# Patient Record
Sex: Female | Born: 1940
Health system: Southern US, Community
[De-identification: ages and names within clinical notes are randomized; demographics above are authoritative.]

---

## 1999-12-31 ENCOUNTER — Other Ambulatory Visit: Admission: RE | Admit: 1999-12-31 | Discharge: 1999-12-31 | Payer: Self-pay | Admitting: Family Medicine

## 2002-08-08 ENCOUNTER — Encounter: Payer: Self-pay | Admitting: General Surgery

## 2002-08-08 ENCOUNTER — Encounter: Admission: RE | Admit: 2002-08-08 | Discharge: 2002-08-08 | Payer: Self-pay | Admitting: General Surgery

## 2003-08-09 ENCOUNTER — Encounter: Admission: RE | Admit: 2003-08-09 | Discharge: 2003-08-09 | Payer: Self-pay | Admitting: General Surgery

## 2004-07-22 ENCOUNTER — Ambulatory Visit: Payer: Self-pay | Admitting: Family Medicine

## 2004-08-07 ENCOUNTER — Ambulatory Visit: Payer: Self-pay | Admitting: Family Medicine

## 2004-09-30 ENCOUNTER — Ambulatory Visit: Payer: Self-pay | Admitting: Family Medicine

## 2005-08-04 ENCOUNTER — Encounter: Admission: RE | Admit: 2005-08-04 | Discharge: 2005-08-04 | Payer: Self-pay | Admitting: Family Medicine

## 2006-09-01 ENCOUNTER — Encounter: Admission: RE | Admit: 2006-09-01 | Discharge: 2006-09-01 | Payer: Self-pay | Admitting: Family Medicine

## 2007-09-06 ENCOUNTER — Encounter: Admission: RE | Admit: 2007-09-06 | Discharge: 2007-09-06 | Payer: Self-pay | Admitting: Family Medicine

## 2008-09-10 ENCOUNTER — Encounter: Admission: RE | Admit: 2008-09-10 | Discharge: 2008-09-10 | Payer: Self-pay | Admitting: Family Medicine

## 2009-09-17 ENCOUNTER — Encounter: Admission: RE | Admit: 2009-09-17 | Discharge: 2009-09-17 | Payer: Self-pay | Admitting: Family Medicine

## 2010-08-27 ENCOUNTER — Other Ambulatory Visit: Payer: Self-pay | Admitting: Family Medicine

## 2010-08-27 DIAGNOSIS — Z1231 Encounter for screening mammogram for malignant neoplasm of breast: Secondary | ICD-10-CM

## 2010-10-02 ENCOUNTER — Ambulatory Visit
Admission: RE | Admit: 2010-10-02 | Discharge: 2010-10-02 | Disposition: A | Discharge status: Home / Self Care | Payer: No Typology Code available for payment source | Source: Ambulatory Visit | Attending: Family Medicine | Admitting: Family Medicine

## 2010-10-02 DIAGNOSIS — Z1231 Encounter for screening mammogram for malignant neoplasm of breast: Secondary | ICD-10-CM

## 2011-03-19 DIAGNOSIS — E119 Type 2 diabetes mellitus without complications: Secondary | ICD-10-CM | POA: Diagnosis not present

## 2011-03-19 DIAGNOSIS — I10 Essential (primary) hypertension: Secondary | ICD-10-CM | POA: Diagnosis not present

## 2011-03-19 DIAGNOSIS — E782 Mixed hyperlipidemia: Secondary | ICD-10-CM | POA: Diagnosis not present

## 2011-07-21 DIAGNOSIS — E782 Mixed hyperlipidemia: Secondary | ICD-10-CM | POA: Diagnosis not present

## 2011-07-21 DIAGNOSIS — E119 Type 2 diabetes mellitus without complications: Secondary | ICD-10-CM | POA: Diagnosis not present

## 2011-07-21 DIAGNOSIS — I10 Essential (primary) hypertension: Secondary | ICD-10-CM | POA: Diagnosis not present

## 2011-10-26 ENCOUNTER — Other Ambulatory Visit: Payer: Self-pay | Admitting: Family Medicine

## 2011-10-26 DIAGNOSIS — Z1231 Encounter for screening mammogram for malignant neoplasm of breast: Secondary | ICD-10-CM

## 2011-11-16 ENCOUNTER — Ambulatory Visit
Admission: RE | Admit: 2011-11-16 | Discharge: 2011-11-16 | Disposition: A | Discharge status: Home / Self Care | Payer: No Typology Code available for payment source | Source: Ambulatory Visit | Attending: Family Medicine | Admitting: Family Medicine

## 2011-11-16 DIAGNOSIS — Z1231 Encounter for screening mammogram for malignant neoplasm of breast: Secondary | ICD-10-CM

## 2011-11-24 DIAGNOSIS — I10 Essential (primary) hypertension: Secondary | ICD-10-CM | POA: Diagnosis not present

## 2011-11-24 DIAGNOSIS — E782 Mixed hyperlipidemia: Secondary | ICD-10-CM | POA: Diagnosis not present

## 2011-11-24 DIAGNOSIS — E119 Type 2 diabetes mellitus without complications: Secondary | ICD-10-CM | POA: Diagnosis not present

## 2012-03-31 DIAGNOSIS — E119 Type 2 diabetes mellitus without complications: Secondary | ICD-10-CM | POA: Diagnosis not present

## 2012-03-31 DIAGNOSIS — I10 Essential (primary) hypertension: Secondary | ICD-10-CM | POA: Diagnosis not present

## 2012-03-31 DIAGNOSIS — E782 Mixed hyperlipidemia: Secondary | ICD-10-CM | POA: Diagnosis not present

## 2012-08-16 DIAGNOSIS — E119 Type 2 diabetes mellitus without complications: Secondary | ICD-10-CM | POA: Diagnosis not present

## 2012-08-16 DIAGNOSIS — Z1331 Encounter for screening for depression: Secondary | ICD-10-CM | POA: Diagnosis not present

## 2012-08-16 DIAGNOSIS — E782 Mixed hyperlipidemia: Secondary | ICD-10-CM | POA: Diagnosis not present

## 2012-08-16 DIAGNOSIS — I10 Essential (primary) hypertension: Secondary | ICD-10-CM | POA: Diagnosis not present

## 2012-10-19 ENCOUNTER — Other Ambulatory Visit: Payer: Self-pay

## 2012-10-19 DIAGNOSIS — Z1231 Encounter for screening mammogram for malignant neoplasm of breast: Secondary | ICD-10-CM

## 2012-11-16 ENCOUNTER — Ambulatory Visit
Admission: RE | Admit: 2012-11-16 | Discharge: 2012-11-16 | Disposition: A | Discharge status: Home / Self Care | Payer: No Typology Code available for payment source | Source: Ambulatory Visit

## 2012-11-16 DIAGNOSIS — Z1231 Encounter for screening mammogram for malignant neoplasm of breast: Secondary | ICD-10-CM

## 2012-12-22 DIAGNOSIS — E119 Type 2 diabetes mellitus without complications: Secondary | ICD-10-CM | POA: Diagnosis not present

## 2012-12-22 DIAGNOSIS — I10 Essential (primary) hypertension: Secondary | ICD-10-CM | POA: Diagnosis not present

## 2012-12-22 DIAGNOSIS — Z1389 Encounter for screening for other disorder: Secondary | ICD-10-CM | POA: Diagnosis not present

## 2012-12-22 DIAGNOSIS — E782 Mixed hyperlipidemia: Secondary | ICD-10-CM | POA: Diagnosis not present

## 2013-04-20 DIAGNOSIS — E119 Type 2 diabetes mellitus without complications: Secondary | ICD-10-CM | POA: Diagnosis not present

## 2013-04-20 DIAGNOSIS — Z6828 Body mass index (BMI) 28.0-28.9, adult: Secondary | ICD-10-CM | POA: Diagnosis not present

## 2013-04-20 DIAGNOSIS — I10 Essential (primary) hypertension: Secondary | ICD-10-CM | POA: Diagnosis not present

## 2013-04-20 DIAGNOSIS — E782 Mixed hyperlipidemia: Secondary | ICD-10-CM | POA: Diagnosis not present

## 2013-08-22 DIAGNOSIS — E119 Type 2 diabetes mellitus without complications: Secondary | ICD-10-CM | POA: Diagnosis not present

## 2013-08-22 DIAGNOSIS — E782 Mixed hyperlipidemia: Secondary | ICD-10-CM | POA: Diagnosis not present

## 2013-08-22 DIAGNOSIS — I10 Essential (primary) hypertension: Secondary | ICD-10-CM | POA: Diagnosis not present

## 2013-08-22 DIAGNOSIS — Z6827 Body mass index (BMI) 27.0-27.9, adult: Secondary | ICD-10-CM | POA: Diagnosis not present

## 2013-08-22 DIAGNOSIS — IMO0001 Reserved for inherently not codable concepts without codable children: Secondary | ICD-10-CM | POA: Diagnosis not present

## 2013-10-12 ENCOUNTER — Other Ambulatory Visit: Payer: Self-pay

## 2013-10-12 DIAGNOSIS — Z1231 Encounter for screening mammogram for malignant neoplasm of breast: Secondary | ICD-10-CM

## 2013-11-20 ENCOUNTER — Ambulatory Visit
Admission: RE | Admit: 2013-11-20 | Discharge: 2013-11-20 | Disposition: A | Discharge status: Home / Self Care | Payer: Medicare Other | Source: Ambulatory Visit

## 2013-11-20 DIAGNOSIS — Z1231 Encounter for screening mammogram for malignant neoplasm of breast: Secondary | ICD-10-CM | POA: Diagnosis not present

## 2013-12-20 DIAGNOSIS — Z1389 Encounter for screening for other disorder: Secondary | ICD-10-CM | POA: Diagnosis not present

## 2013-12-20 DIAGNOSIS — Z0000 Encounter for general adult medical examination without abnormal findings: Secondary | ICD-10-CM | POA: Diagnosis not present

## 2013-12-20 DIAGNOSIS — E782 Mixed hyperlipidemia: Secondary | ICD-10-CM | POA: Diagnosis not present

## 2013-12-20 DIAGNOSIS — E119 Type 2 diabetes mellitus without complications: Secondary | ICD-10-CM | POA: Diagnosis not present

## 2013-12-20 DIAGNOSIS — E1165 Type 2 diabetes mellitus with hyperglycemia: Secondary | ICD-10-CM | POA: Diagnosis not present

## 2013-12-20 DIAGNOSIS — I10 Essential (primary) hypertension: Secondary | ICD-10-CM | POA: Diagnosis not present

## 2013-12-20 DIAGNOSIS — Z139 Encounter for screening, unspecified: Secondary | ICD-10-CM | POA: Diagnosis not present

## 2014-04-16 DIAGNOSIS — E119 Type 2 diabetes mellitus without complications: Secondary | ICD-10-CM | POA: Diagnosis not present

## 2014-05-07 DIAGNOSIS — E785 Hyperlipidemia, unspecified: Secondary | ICD-10-CM | POA: Diagnosis not present

## 2014-05-07 DIAGNOSIS — E119 Type 2 diabetes mellitus without complications: Secondary | ICD-10-CM | POA: Diagnosis not present

## 2014-05-07 DIAGNOSIS — I10 Essential (primary) hypertension: Secondary | ICD-10-CM | POA: Diagnosis not present

## 2014-05-07 DIAGNOSIS — E782 Mixed hyperlipidemia: Secondary | ICD-10-CM | POA: Diagnosis not present

## 2014-05-07 DIAGNOSIS — E1169 Type 2 diabetes mellitus with other specified complication: Secondary | ICD-10-CM | POA: Diagnosis not present

## 2014-09-17 DIAGNOSIS — I10 Essential (primary) hypertension: Secondary | ICD-10-CM | POA: Diagnosis not present

## 2014-09-17 DIAGNOSIS — E1169 Type 2 diabetes mellitus with other specified complication: Secondary | ICD-10-CM | POA: Diagnosis not present

## 2014-09-17 DIAGNOSIS — E119 Type 2 diabetes mellitus without complications: Secondary | ICD-10-CM | POA: Diagnosis not present

## 2014-09-17 DIAGNOSIS — E1159 Type 2 diabetes mellitus with other circulatory complications: Secondary | ICD-10-CM | POA: Diagnosis not present

## 2014-09-17 DIAGNOSIS — E785 Hyperlipidemia, unspecified: Secondary | ICD-10-CM | POA: Diagnosis not present

## 2014-10-30 ENCOUNTER — Other Ambulatory Visit: Payer: Self-pay

## 2014-10-30 DIAGNOSIS — Z1231 Encounter for screening mammogram for malignant neoplasm of breast: Secondary | ICD-10-CM

## 2014-11-05 DIAGNOSIS — E782 Mixed hyperlipidemia: Secondary | ICD-10-CM | POA: Diagnosis not present

## 2014-11-05 DIAGNOSIS — I10 Essential (primary) hypertension: Secondary | ICD-10-CM | POA: Diagnosis not present

## 2014-11-05 DIAGNOSIS — E1159 Type 2 diabetes mellitus with other circulatory complications: Secondary | ICD-10-CM | POA: Diagnosis not present

## 2014-11-22 ENCOUNTER — Ambulatory Visit
Admission: RE | Admit: 2014-11-22 | Discharge: 2014-11-22 | Disposition: A | Discharge status: Home / Self Care | Payer: Medicare Other | Source: Ambulatory Visit

## 2014-11-22 DIAGNOSIS — Z1231 Encounter for screening mammogram for malignant neoplasm of breast: Secondary | ICD-10-CM | POA: Diagnosis not present

## 2015-01-01 DIAGNOSIS — Z139 Encounter for screening, unspecified: Secondary | ICD-10-CM | POA: Diagnosis not present

## 2015-01-01 DIAGNOSIS — Z1389 Encounter for screening for other disorder: Secondary | ICD-10-CM | POA: Diagnosis not present

## 2015-01-01 DIAGNOSIS — Z9181 History of falling: Secondary | ICD-10-CM | POA: Diagnosis not present

## 2015-01-01 DIAGNOSIS — Z0000 Encounter for general adult medical examination without abnormal findings: Secondary | ICD-10-CM | POA: Diagnosis not present

## 2015-01-21 DIAGNOSIS — E1159 Type 2 diabetes mellitus with other circulatory complications: Secondary | ICD-10-CM | POA: Diagnosis not present

## 2015-01-21 DIAGNOSIS — E785 Hyperlipidemia, unspecified: Secondary | ICD-10-CM | POA: Diagnosis not present

## 2015-01-21 DIAGNOSIS — E119 Type 2 diabetes mellitus without complications: Secondary | ICD-10-CM | POA: Diagnosis not present

## 2015-01-21 DIAGNOSIS — E1169 Type 2 diabetes mellitus with other specified complication: Secondary | ICD-10-CM | POA: Diagnosis not present

## 2015-03-20 DIAGNOSIS — E119 Type 2 diabetes mellitus without complications: Secondary | ICD-10-CM | POA: Diagnosis not present

## 2015-03-20 DIAGNOSIS — E782 Mixed hyperlipidemia: Secondary | ICD-10-CM | POA: Diagnosis not present

## 2015-03-20 DIAGNOSIS — E1169 Type 2 diabetes mellitus with other specified complication: Secondary | ICD-10-CM | POA: Diagnosis not present

## 2015-03-29 DIAGNOSIS — I10 Essential (primary) hypertension: Secondary | ICD-10-CM | POA: Diagnosis not present

## 2015-03-29 DIAGNOSIS — E785 Hyperlipidemia, unspecified: Secondary | ICD-10-CM | POA: Diagnosis not present

## 2015-03-29 DIAGNOSIS — E119 Type 2 diabetes mellitus without complications: Secondary | ICD-10-CM | POA: Diagnosis not present

## 2015-03-29 DIAGNOSIS — E1169 Type 2 diabetes mellitus with other specified complication: Secondary | ICD-10-CM | POA: Diagnosis not present

## 2015-07-17 DIAGNOSIS — E1169 Type 2 diabetes mellitus with other specified complication: Secondary | ICD-10-CM | POA: Diagnosis not present

## 2015-07-17 DIAGNOSIS — E1159 Type 2 diabetes mellitus with other circulatory complications: Secondary | ICD-10-CM | POA: Diagnosis not present

## 2015-07-17 DIAGNOSIS — E119 Type 2 diabetes mellitus without complications: Secondary | ICD-10-CM | POA: Diagnosis not present

## 2015-07-22 DIAGNOSIS — E1129 Type 2 diabetes mellitus with other diabetic kidney complication: Secondary | ICD-10-CM | POA: Diagnosis not present

## 2015-07-22 DIAGNOSIS — E1159 Type 2 diabetes mellitus with other circulatory complications: Secondary | ICD-10-CM | POA: Diagnosis not present

## 2015-07-22 DIAGNOSIS — I10 Essential (primary) hypertension: Secondary | ICD-10-CM | POA: Diagnosis not present

## 2015-07-22 DIAGNOSIS — E782 Mixed hyperlipidemia: Secondary | ICD-10-CM | POA: Diagnosis not present

## 2015-10-21 ENCOUNTER — Other Ambulatory Visit: Payer: Self-pay | Admitting: Family Medicine

## 2015-10-21 DIAGNOSIS — Z1231 Encounter for screening mammogram for malignant neoplasm of breast: Secondary | ICD-10-CM

## 2015-11-05 DIAGNOSIS — E1169 Type 2 diabetes mellitus with other specified complication: Secondary | ICD-10-CM | POA: Diagnosis not present

## 2015-11-05 DIAGNOSIS — E119 Type 2 diabetes mellitus without complications: Secondary | ICD-10-CM | POA: Diagnosis not present

## 2015-11-05 DIAGNOSIS — E1159 Type 2 diabetes mellitus with other circulatory complications: Secondary | ICD-10-CM | POA: Diagnosis not present

## 2015-11-18 DIAGNOSIS — I10 Essential (primary) hypertension: Secondary | ICD-10-CM | POA: Diagnosis not present

## 2015-11-18 DIAGNOSIS — E1159 Type 2 diabetes mellitus with other circulatory complications: Secondary | ICD-10-CM | POA: Diagnosis not present

## 2015-11-18 DIAGNOSIS — E119 Type 2 diabetes mellitus without complications: Secondary | ICD-10-CM | POA: Diagnosis not present

## 2015-11-18 DIAGNOSIS — E782 Mixed hyperlipidemia: Secondary | ICD-10-CM | POA: Diagnosis not present

## 2015-11-26 ENCOUNTER — Ambulatory Visit
Admission: RE | Admit: 2015-11-26 | Discharge: 2015-11-26 | Disposition: A | Discharge status: Home / Self Care | Payer: Medicare Other | Source: Ambulatory Visit | Attending: Family Medicine | Admitting: Family Medicine

## 2015-11-26 DIAGNOSIS — Z1231 Encounter for screening mammogram for malignant neoplasm of breast: Secondary | ICD-10-CM | POA: Diagnosis not present

## 2016-01-15 DIAGNOSIS — Z1389 Encounter for screening for other disorder: Secondary | ICD-10-CM | POA: Diagnosis not present

## 2016-01-15 DIAGNOSIS — Z0000 Encounter for general adult medical examination without abnormal findings: Secondary | ICD-10-CM | POA: Diagnosis not present

## 2016-01-15 DIAGNOSIS — Z139 Encounter for screening, unspecified: Secondary | ICD-10-CM | POA: Diagnosis not present

## 2016-04-06 DIAGNOSIS — E1159 Type 2 diabetes mellitus with other circulatory complications: Secondary | ICD-10-CM | POA: Diagnosis not present

## 2016-04-06 DIAGNOSIS — E119 Type 2 diabetes mellitus without complications: Secondary | ICD-10-CM | POA: Diagnosis not present

## 2016-04-06 DIAGNOSIS — E1169 Type 2 diabetes mellitus with other specified complication: Secondary | ICD-10-CM | POA: Diagnosis not present

## 2016-04-10 DIAGNOSIS — E1159 Type 2 diabetes mellitus with other circulatory complications: Secondary | ICD-10-CM | POA: Diagnosis not present

## 2016-04-10 DIAGNOSIS — E1129 Type 2 diabetes mellitus with other diabetic kidney complication: Secondary | ICD-10-CM | POA: Diagnosis not present

## 2016-04-10 DIAGNOSIS — I10 Essential (primary) hypertension: Secondary | ICD-10-CM | POA: Diagnosis not present

## 2016-04-10 DIAGNOSIS — E782 Mixed hyperlipidemia: Secondary | ICD-10-CM | POA: Diagnosis not present

## 2016-08-12 DIAGNOSIS — E1159 Type 2 diabetes mellitus with other circulatory complications: Secondary | ICD-10-CM | POA: Diagnosis not present

## 2016-08-12 DIAGNOSIS — E119 Type 2 diabetes mellitus without complications: Secondary | ICD-10-CM | POA: Diagnosis not present

## 2016-08-12 DIAGNOSIS — E1169 Type 2 diabetes mellitus with other specified complication: Secondary | ICD-10-CM | POA: Diagnosis not present

## 2016-08-24 ENCOUNTER — Other Ambulatory Visit: Payer: Self-pay

## 2016-09-21 DIAGNOSIS — Z139 Encounter for screening, unspecified: Secondary | ICD-10-CM | POA: Diagnosis not present

## 2016-09-21 DIAGNOSIS — I10 Essential (primary) hypertension: Secondary | ICD-10-CM | POA: Diagnosis not present

## 2016-09-21 DIAGNOSIS — F3160 Bipolar disorder, current episode mixed, unspecified: Secondary | ICD-10-CM | POA: Diagnosis not present

## 2016-09-21 DIAGNOSIS — E119 Type 2 diabetes mellitus without complications: Secondary | ICD-10-CM | POA: Diagnosis not present

## 2016-09-21 DIAGNOSIS — E1169 Type 2 diabetes mellitus with other specified complication: Secondary | ICD-10-CM | POA: Diagnosis not present

## 2016-09-21 DIAGNOSIS — M79609 Pain in unspecified limb: Secondary | ICD-10-CM | POA: Diagnosis not present

## 2016-09-21 DIAGNOSIS — E1159 Type 2 diabetes mellitus with other circulatory complications: Secondary | ICD-10-CM | POA: Diagnosis not present

## 2016-09-21 DIAGNOSIS — Z1389 Encounter for screening for other disorder: Secondary | ICD-10-CM | POA: Diagnosis not present

## 2016-09-21 DIAGNOSIS — E109 Type 1 diabetes mellitus without complications: Secondary | ICD-10-CM | POA: Diagnosis not present

## 2016-10-23 ENCOUNTER — Other Ambulatory Visit: Payer: Self-pay | Admitting: Family Medicine

## 2016-10-23 DIAGNOSIS — Z1231 Encounter for screening mammogram for malignant neoplasm of breast: Secondary | ICD-10-CM

## 2016-11-26 ENCOUNTER — Ambulatory Visit
Admission: RE | Admit: 2016-11-26 | Discharge: 2016-11-26 | Disposition: A | Discharge status: Home / Self Care | Payer: Medicare Other | Source: Ambulatory Visit | Attending: Family Medicine | Admitting: Family Medicine

## 2016-11-26 DIAGNOSIS — Z1231 Encounter for screening mammogram for malignant neoplasm of breast: Secondary | ICD-10-CM

## 2017-01-13 DIAGNOSIS — E1159 Type 2 diabetes mellitus with other circulatory complications: Secondary | ICD-10-CM | POA: Diagnosis not present

## 2017-01-13 DIAGNOSIS — E119 Type 2 diabetes mellitus without complications: Secondary | ICD-10-CM | POA: Diagnosis not present

## 2017-01-13 DIAGNOSIS — E1169 Type 2 diabetes mellitus with other specified complication: Secondary | ICD-10-CM | POA: Diagnosis not present

## 2017-01-18 DIAGNOSIS — Z0000 Encounter for general adult medical examination without abnormal findings: Secondary | ICD-10-CM | POA: Diagnosis not present

## 2017-01-18 DIAGNOSIS — Z9181 History of falling: Secondary | ICD-10-CM | POA: Diagnosis not present

## 2017-01-18 DIAGNOSIS — Z1331 Encounter for screening for depression: Secondary | ICD-10-CM | POA: Diagnosis not present

## 2017-01-18 DIAGNOSIS — E1159 Type 2 diabetes mellitus with other circulatory complications: Secondary | ICD-10-CM | POA: Diagnosis not present

## 2017-01-18 DIAGNOSIS — E1169 Type 2 diabetes mellitus with other specified complication: Secondary | ICD-10-CM | POA: Diagnosis not present

## 2017-01-18 DIAGNOSIS — I10 Essential (primary) hypertension: Secondary | ICD-10-CM | POA: Diagnosis not present

## 2017-01-18 DIAGNOSIS — Z139 Encounter for screening, unspecified: Secondary | ICD-10-CM | POA: Diagnosis not present

## 2017-01-18 DIAGNOSIS — E119 Type 2 diabetes mellitus without complications: Secondary | ICD-10-CM | POA: Diagnosis not present

## 2017-05-10 DIAGNOSIS — E119 Type 2 diabetes mellitus without complications: Secondary | ICD-10-CM | POA: Diagnosis not present

## 2017-05-10 DIAGNOSIS — E1159 Type 2 diabetes mellitus with other circulatory complications: Secondary | ICD-10-CM | POA: Diagnosis not present

## 2017-05-10 DIAGNOSIS — E1169 Type 2 diabetes mellitus with other specified complication: Secondary | ICD-10-CM | POA: Diagnosis not present

## 2017-05-19 DIAGNOSIS — E1159 Type 2 diabetes mellitus with other circulatory complications: Secondary | ICD-10-CM | POA: Diagnosis not present

## 2017-05-19 DIAGNOSIS — I10 Essential (primary) hypertension: Secondary | ICD-10-CM | POA: Diagnosis not present

## 2017-05-19 DIAGNOSIS — E1169 Type 2 diabetes mellitus with other specified complication: Secondary | ICD-10-CM | POA: Diagnosis not present

## 2017-05-19 DIAGNOSIS — E119 Type 2 diabetes mellitus without complications: Secondary | ICD-10-CM | POA: Diagnosis not present

## 2017-06-15 DIAGNOSIS — E113292 Type 2 diabetes mellitus with mild nonproliferative diabetic retinopathy without macular edema, left eye: Secondary | ICD-10-CM | POA: Diagnosis not present

## 2017-06-15 DIAGNOSIS — H25813 Combined forms of age-related cataract, bilateral: Secondary | ICD-10-CM | POA: Diagnosis not present

## 2017-09-14 DIAGNOSIS — E119 Type 2 diabetes mellitus without complications: Secondary | ICD-10-CM | POA: Diagnosis not present

## 2017-09-14 DIAGNOSIS — E1159 Type 2 diabetes mellitus with other circulatory complications: Secondary | ICD-10-CM | POA: Diagnosis not present

## 2017-09-14 DIAGNOSIS — E1169 Type 2 diabetes mellitus with other specified complication: Secondary | ICD-10-CM | POA: Diagnosis not present

## 2017-09-20 DIAGNOSIS — E1159 Type 2 diabetes mellitus with other circulatory complications: Secondary | ICD-10-CM | POA: Diagnosis not present

## 2017-09-20 DIAGNOSIS — Z1331 Encounter for screening for depression: Secondary | ICD-10-CM | POA: Diagnosis not present

## 2017-09-20 DIAGNOSIS — E785 Hyperlipidemia, unspecified: Secondary | ICD-10-CM | POA: Diagnosis not present

## 2017-09-20 DIAGNOSIS — E1169 Type 2 diabetes mellitus with other specified complication: Secondary | ICD-10-CM | POA: Diagnosis not present

## 2017-09-20 DIAGNOSIS — Z0000 Encounter for general adult medical examination without abnormal findings: Secondary | ICD-10-CM | POA: Diagnosis not present

## 2017-09-20 DIAGNOSIS — E119 Type 2 diabetes mellitus without complications: Secondary | ICD-10-CM | POA: Diagnosis not present

## 2017-10-18 ENCOUNTER — Other Ambulatory Visit: Payer: Self-pay | Admitting: Family Medicine

## 2017-10-18 DIAGNOSIS — Z1231 Encounter for screening mammogram for malignant neoplasm of breast: Secondary | ICD-10-CM

## 2017-11-29 ENCOUNTER — Ambulatory Visit
Admission: RE | Admit: 2017-11-29 | Discharge: 2017-11-29 | Disposition: A | Discharge status: Home / Self Care | Payer: Medicare HMO | Source: Ambulatory Visit | Attending: Family Medicine | Admitting: Family Medicine

## 2017-11-29 DIAGNOSIS — Z1231 Encounter for screening mammogram for malignant neoplasm of breast: Secondary | ICD-10-CM | POA: Diagnosis not present

## 2017-12-13 DIAGNOSIS — E1169 Type 2 diabetes mellitus with other specified complication: Secondary | ICD-10-CM | POA: Diagnosis not present

## 2017-12-13 DIAGNOSIS — E119 Type 2 diabetes mellitus without complications: Secondary | ICD-10-CM | POA: Diagnosis not present

## 2017-12-13 DIAGNOSIS — E1159 Type 2 diabetes mellitus with other circulatory complications: Secondary | ICD-10-CM | POA: Diagnosis not present

## 2017-12-20 DIAGNOSIS — E1169 Type 2 diabetes mellitus with other specified complication: Secondary | ICD-10-CM | POA: Diagnosis not present

## 2017-12-20 DIAGNOSIS — R69 Illness, unspecified: Secondary | ICD-10-CM | POA: Diagnosis not present

## 2017-12-20 DIAGNOSIS — E1165 Type 2 diabetes mellitus with hyperglycemia: Secondary | ICD-10-CM | POA: Diagnosis not present

## 2017-12-20 DIAGNOSIS — E785 Hyperlipidemia, unspecified: Secondary | ICD-10-CM | POA: Diagnosis not present

## 2017-12-20 DIAGNOSIS — E1159 Type 2 diabetes mellitus with other circulatory complications: Secondary | ICD-10-CM | POA: Diagnosis not present

## 2018-01-19 DIAGNOSIS — E1165 Type 2 diabetes mellitus with hyperglycemia: Secondary | ICD-10-CM | POA: Diagnosis not present

## 2018-01-19 DIAGNOSIS — Z6822 Body mass index (BMI) 22.0-22.9, adult: Secondary | ICD-10-CM | POA: Diagnosis not present

## 2018-04-13 DIAGNOSIS — E1159 Type 2 diabetes mellitus with other circulatory complications: Secondary | ICD-10-CM | POA: Diagnosis not present

## 2018-04-13 DIAGNOSIS — E119 Type 2 diabetes mellitus without complications: Secondary | ICD-10-CM | POA: Diagnosis not present

## 2018-04-13 DIAGNOSIS — E1169 Type 2 diabetes mellitus with other specified complication: Secondary | ICD-10-CM | POA: Diagnosis not present

## 2018-04-20 DIAGNOSIS — E1129 Type 2 diabetes mellitus with other diabetic kidney complication: Secondary | ICD-10-CM | POA: Diagnosis not present

## 2018-04-20 DIAGNOSIS — I10 Essential (primary) hypertension: Secondary | ICD-10-CM | POA: Diagnosis not present

## 2018-04-20 DIAGNOSIS — E1169 Type 2 diabetes mellitus with other specified complication: Secondary | ICD-10-CM | POA: Diagnosis not present

## 2018-04-20 DIAGNOSIS — Z789 Other specified health status: Secondary | ICD-10-CM | POA: Diagnosis not present

## 2018-04-20 DIAGNOSIS — E1159 Type 2 diabetes mellitus with other circulatory complications: Secondary | ICD-10-CM | POA: Diagnosis not present

## 2018-05-03 DIAGNOSIS — E1169 Type 2 diabetes mellitus with other specified complication: Secondary | ICD-10-CM | POA: Diagnosis not present

## 2018-05-03 DIAGNOSIS — E1129 Type 2 diabetes mellitus with other diabetic kidney complication: Secondary | ICD-10-CM | POA: Diagnosis not present

## 2018-05-03 DIAGNOSIS — I10 Essential (primary) hypertension: Secondary | ICD-10-CM | POA: Diagnosis not present

## 2018-05-03 DIAGNOSIS — E1159 Type 2 diabetes mellitus with other circulatory complications: Secondary | ICD-10-CM | POA: Diagnosis not present

## 2018-06-02 DIAGNOSIS — E1159 Type 2 diabetes mellitus with other circulatory complications: Secondary | ICD-10-CM | POA: Diagnosis not present

## 2018-06-02 DIAGNOSIS — E1169 Type 2 diabetes mellitus with other specified complication: Secondary | ICD-10-CM | POA: Diagnosis not present

## 2018-06-02 DIAGNOSIS — I10 Essential (primary) hypertension: Secondary | ICD-10-CM | POA: Diagnosis not present

## 2018-06-02 DIAGNOSIS — E1129 Type 2 diabetes mellitus with other diabetic kidney complication: Secondary | ICD-10-CM | POA: Diagnosis not present

## 2018-07-01 DIAGNOSIS — E1169 Type 2 diabetes mellitus with other specified complication: Secondary | ICD-10-CM | POA: Diagnosis not present

## 2018-07-01 DIAGNOSIS — E1159 Type 2 diabetes mellitus with other circulatory complications: Secondary | ICD-10-CM | POA: Diagnosis not present

## 2018-07-01 DIAGNOSIS — I10 Essential (primary) hypertension: Secondary | ICD-10-CM | POA: Diagnosis not present

## 2018-07-01 DIAGNOSIS — E1129 Type 2 diabetes mellitus with other diabetic kidney complication: Secondary | ICD-10-CM | POA: Diagnosis not present

## 2018-08-02 DIAGNOSIS — E1129 Type 2 diabetes mellitus with other diabetic kidney complication: Secondary | ICD-10-CM | POA: Diagnosis not present

## 2018-08-02 DIAGNOSIS — E1159 Type 2 diabetes mellitus with other circulatory complications: Secondary | ICD-10-CM | POA: Diagnosis not present

## 2018-08-02 DIAGNOSIS — I10 Essential (primary) hypertension: Secondary | ICD-10-CM | POA: Diagnosis not present

## 2018-08-02 DIAGNOSIS — E1169 Type 2 diabetes mellitus with other specified complication: Secondary | ICD-10-CM | POA: Diagnosis not present

## 2018-08-10 DIAGNOSIS — E1169 Type 2 diabetes mellitus with other specified complication: Secondary | ICD-10-CM | POA: Diagnosis not present

## 2018-08-10 DIAGNOSIS — E1159 Type 2 diabetes mellitus with other circulatory complications: Secondary | ICD-10-CM | POA: Diagnosis not present

## 2018-08-10 DIAGNOSIS — E119 Type 2 diabetes mellitus without complications: Secondary | ICD-10-CM | POA: Diagnosis not present

## 2018-08-17 DIAGNOSIS — E785 Hyperlipidemia, unspecified: Secondary | ICD-10-CM | POA: Diagnosis not present

## 2018-08-17 DIAGNOSIS — E1169 Type 2 diabetes mellitus with other specified complication: Secondary | ICD-10-CM | POA: Diagnosis not present

## 2018-08-17 DIAGNOSIS — E119 Type 2 diabetes mellitus without complications: Secondary | ICD-10-CM | POA: Diagnosis not present

## 2018-08-17 DIAGNOSIS — E1159 Type 2 diabetes mellitus with other circulatory complications: Secondary | ICD-10-CM | POA: Diagnosis not present

## 2018-09-02 DIAGNOSIS — E1169 Type 2 diabetes mellitus with other specified complication: Secondary | ICD-10-CM | POA: Diagnosis not present

## 2018-09-02 DIAGNOSIS — E785 Hyperlipidemia, unspecified: Secondary | ICD-10-CM | POA: Diagnosis not present

## 2018-09-02 DIAGNOSIS — E1159 Type 2 diabetes mellitus with other circulatory complications: Secondary | ICD-10-CM | POA: Diagnosis not present

## 2018-09-02 DIAGNOSIS — E119 Type 2 diabetes mellitus without complications: Secondary | ICD-10-CM | POA: Diagnosis not present

## 2018-09-14 DIAGNOSIS — Z0000 Encounter for general adult medical examination without abnormal findings: Secondary | ICD-10-CM | POA: Diagnosis not present

## 2018-09-14 DIAGNOSIS — Z139 Encounter for screening, unspecified: Secondary | ICD-10-CM | POA: Diagnosis not present

## 2018-10-03 DIAGNOSIS — E1169 Type 2 diabetes mellitus with other specified complication: Secondary | ICD-10-CM | POA: Diagnosis not present

## 2018-10-03 DIAGNOSIS — E119 Type 2 diabetes mellitus without complications: Secondary | ICD-10-CM | POA: Diagnosis not present

## 2018-10-03 DIAGNOSIS — E785 Hyperlipidemia, unspecified: Secondary | ICD-10-CM | POA: Diagnosis not present

## 2018-10-03 DIAGNOSIS — I10 Essential (primary) hypertension: Secondary | ICD-10-CM | POA: Diagnosis not present

## 2018-10-18 ENCOUNTER — Other Ambulatory Visit: Payer: Self-pay | Admitting: Family Medicine

## 2018-10-18 DIAGNOSIS — Z1231 Encounter for screening mammogram for malignant neoplasm of breast: Secondary | ICD-10-CM

## 2018-11-02 DIAGNOSIS — E785 Hyperlipidemia, unspecified: Secondary | ICD-10-CM | POA: Diagnosis not present

## 2018-11-02 DIAGNOSIS — E1169 Type 2 diabetes mellitus with other specified complication: Secondary | ICD-10-CM | POA: Diagnosis not present

## 2018-11-02 DIAGNOSIS — I10 Essential (primary) hypertension: Secondary | ICD-10-CM | POA: Diagnosis not present

## 2018-11-02 DIAGNOSIS — E119 Type 2 diabetes mellitus without complications: Secondary | ICD-10-CM | POA: Diagnosis not present

## 2018-12-02 ENCOUNTER — Other Ambulatory Visit: Payer: Self-pay

## 2018-12-02 ENCOUNTER — Ambulatory Visit
Admission: RE | Admit: 2018-12-02 | Discharge: 2018-12-02 | Disposition: A | Discharge status: Home / Self Care | Payer: Medicare Other | Source: Ambulatory Visit | Attending: Family Medicine | Admitting: Family Medicine

## 2018-12-02 DIAGNOSIS — E785 Hyperlipidemia, unspecified: Secondary | ICD-10-CM | POA: Diagnosis not present

## 2018-12-02 DIAGNOSIS — Z1231 Encounter for screening mammogram for malignant neoplasm of breast: Secondary | ICD-10-CM

## 2018-12-02 DIAGNOSIS — E119 Type 2 diabetes mellitus without complications: Secondary | ICD-10-CM | POA: Diagnosis not present

## 2018-12-02 DIAGNOSIS — E1169 Type 2 diabetes mellitus with other specified complication: Secondary | ICD-10-CM | POA: Diagnosis not present

## 2018-12-02 DIAGNOSIS — I10 Essential (primary) hypertension: Secondary | ICD-10-CM | POA: Diagnosis not present

## 2019-01-02 DIAGNOSIS — E119 Type 2 diabetes mellitus without complications: Secondary | ICD-10-CM | POA: Diagnosis not present

## 2019-01-02 DIAGNOSIS — E785 Hyperlipidemia, unspecified: Secondary | ICD-10-CM | POA: Diagnosis not present

## 2019-01-02 DIAGNOSIS — I10 Essential (primary) hypertension: Secondary | ICD-10-CM | POA: Diagnosis not present

## 2019-01-02 DIAGNOSIS — E1169 Type 2 diabetes mellitus with other specified complication: Secondary | ICD-10-CM | POA: Diagnosis not present

## 2019-01-04 DIAGNOSIS — E1169 Type 2 diabetes mellitus with other specified complication: Secondary | ICD-10-CM | POA: Diagnosis not present

## 2019-01-11 DIAGNOSIS — E1159 Type 2 diabetes mellitus with other circulatory complications: Secondary | ICD-10-CM | POA: Diagnosis not present

## 2019-01-11 DIAGNOSIS — I10 Essential (primary) hypertension: Secondary | ICD-10-CM | POA: Diagnosis not present

## 2019-01-11 DIAGNOSIS — E1169 Type 2 diabetes mellitus with other specified complication: Secondary | ICD-10-CM | POA: Diagnosis not present

## 2019-01-11 DIAGNOSIS — E785 Hyperlipidemia, unspecified: Secondary | ICD-10-CM | POA: Diagnosis not present

## 2019-01-12 ENCOUNTER — Other Ambulatory Visit: Payer: Self-pay

## 2019-01-12 NOTE — Patient Outreach (Signed)
[facility] [place] [facility]) Care Management  01/12/2019  Rhonda Hunt 1940-09-24 [id number]   Medication Adherence call to Rhonda Hunt Compliant Voice message left with a call back number. Rhonda Hunt is showing past due on Lisinopril 40 mg under [place].   [place] Management Direct Dial [phone]  Fax [phone] [email or web address]

## 2019-01-23 DIAGNOSIS — H25813 Combined forms of age-related cataract, bilateral: Secondary | ICD-10-CM | POA: Diagnosis not present

## 2019-02-01 DIAGNOSIS — E785 Hyperlipidemia, unspecified: Secondary | ICD-10-CM | POA: Diagnosis not present

## 2019-02-01 DIAGNOSIS — E1159 Type 2 diabetes mellitus with other circulatory complications: Secondary | ICD-10-CM | POA: Diagnosis not present

## 2019-02-01 DIAGNOSIS — I10 Essential (primary) hypertension: Secondary | ICD-10-CM | POA: Diagnosis not present

## 2019-02-01 DIAGNOSIS — E1169 Type 2 diabetes mellitus with other specified complication: Secondary | ICD-10-CM | POA: Diagnosis not present

## 2019-03-03 DIAGNOSIS — E1169 Type 2 diabetes mellitus with other specified complication: Secondary | ICD-10-CM | POA: Diagnosis not present

## 2019-03-03 DIAGNOSIS — E1159 Type 2 diabetes mellitus with other circulatory complications: Secondary | ICD-10-CM | POA: Diagnosis not present

## 2019-03-03 DIAGNOSIS — E785 Hyperlipidemia, unspecified: Secondary | ICD-10-CM | POA: Diagnosis not present

## 2019-03-03 DIAGNOSIS — I10 Essential (primary) hypertension: Secondary | ICD-10-CM | POA: Diagnosis not present

## 2019-04-02 DIAGNOSIS — I10 Essential (primary) hypertension: Secondary | ICD-10-CM | POA: Diagnosis not present

## 2019-04-02 DIAGNOSIS — E1169 Type 2 diabetes mellitus with other specified complication: Secondary | ICD-10-CM | POA: Diagnosis not present

## 2019-04-02 DIAGNOSIS — E785 Hyperlipidemia, unspecified: Secondary | ICD-10-CM | POA: Diagnosis not present

## 2019-04-02 DIAGNOSIS — E1159 Type 2 diabetes mellitus with other circulatory complications: Secondary | ICD-10-CM | POA: Diagnosis not present

## 2019-05-03 DIAGNOSIS — E1169 Type 2 diabetes mellitus with other specified complication: Secondary | ICD-10-CM | POA: Diagnosis not present

## 2019-05-03 DIAGNOSIS — I10 Essential (primary) hypertension: Secondary | ICD-10-CM | POA: Diagnosis not present

## 2019-05-03 DIAGNOSIS — E1159 Type 2 diabetes mellitus with other circulatory complications: Secondary | ICD-10-CM | POA: Diagnosis not present

## 2019-05-03 DIAGNOSIS — E785 Hyperlipidemia, unspecified: Secondary | ICD-10-CM | POA: Diagnosis not present

## 2019-06-02 DIAGNOSIS — I10 Essential (primary) hypertension: Secondary | ICD-10-CM | POA: Diagnosis not present

## 2019-06-02 DIAGNOSIS — E1169 Type 2 diabetes mellitus with other specified complication: Secondary | ICD-10-CM | POA: Diagnosis not present

## 2019-06-02 DIAGNOSIS — E785 Hyperlipidemia, unspecified: Secondary | ICD-10-CM | POA: Diagnosis not present

## 2019-06-02 DIAGNOSIS — E1159 Type 2 diabetes mellitus with other circulatory complications: Secondary | ICD-10-CM | POA: Diagnosis not present

## 2019-07-03 DIAGNOSIS — I10 Essential (primary) hypertension: Secondary | ICD-10-CM | POA: Diagnosis not present

## 2019-07-03 DIAGNOSIS — E1169 Type 2 diabetes mellitus with other specified complication: Secondary | ICD-10-CM | POA: Diagnosis not present

## 2019-07-03 DIAGNOSIS — E785 Hyperlipidemia, unspecified: Secondary | ICD-10-CM | POA: Diagnosis not present

## 2019-07-03 DIAGNOSIS — E1159 Type 2 diabetes mellitus with other circulatory complications: Secondary | ICD-10-CM | POA: Diagnosis not present

## 2019-07-24 DIAGNOSIS — E1169 Type 2 diabetes mellitus with other specified complication: Secondary | ICD-10-CM | POA: Diagnosis not present

## 2019-08-02 DIAGNOSIS — I10 Essential (primary) hypertension: Secondary | ICD-10-CM | POA: Diagnosis not present

## 2019-08-02 DIAGNOSIS — E1169 Type 2 diabetes mellitus with other specified complication: Secondary | ICD-10-CM | POA: Diagnosis not present

## 2019-08-02 DIAGNOSIS — E785 Hyperlipidemia, unspecified: Secondary | ICD-10-CM | POA: Diagnosis not present

## 2019-08-02 DIAGNOSIS — E1159 Type 2 diabetes mellitus with other circulatory complications: Secondary | ICD-10-CM | POA: Diagnosis not present

## 2019-08-16 DIAGNOSIS — Z139 Encounter for screening, unspecified: Secondary | ICD-10-CM | POA: Diagnosis not present

## 2019-08-16 DIAGNOSIS — I152 Hypertension secondary to endocrine disorders: Secondary | ICD-10-CM | POA: Diagnosis not present

## 2019-08-16 DIAGNOSIS — Z0000 Encounter for general adult medical examination without abnormal findings: Secondary | ICD-10-CM | POA: Diagnosis not present

## 2019-08-16 DIAGNOSIS — E1159 Type 2 diabetes mellitus with other circulatory complications: Secondary | ICD-10-CM | POA: Diagnosis not present

## 2019-08-16 DIAGNOSIS — E1169 Type 2 diabetes mellitus with other specified complication: Secondary | ICD-10-CM | POA: Diagnosis not present

## 2019-08-16 DIAGNOSIS — E785 Hyperlipidemia, unspecified: Secondary | ICD-10-CM | POA: Diagnosis not present

## 2019-08-16 DIAGNOSIS — Z136 Encounter for screening for cardiovascular disorders: Secondary | ICD-10-CM | POA: Diagnosis not present

## 2019-09-03 DIAGNOSIS — E1169 Type 2 diabetes mellitus with other specified complication: Secondary | ICD-10-CM | POA: Diagnosis not present

## 2019-09-03 DIAGNOSIS — E785 Hyperlipidemia, unspecified: Secondary | ICD-10-CM | POA: Diagnosis not present

## 2019-09-03 DIAGNOSIS — E1159 Type 2 diabetes mellitus with other circulatory complications: Secondary | ICD-10-CM | POA: Diagnosis not present

## 2019-09-03 DIAGNOSIS — I152 Hypertension secondary to endocrine disorders: Secondary | ICD-10-CM | POA: Diagnosis not present

## 2019-10-03 DIAGNOSIS — E1169 Type 2 diabetes mellitus with other specified complication: Secondary | ICD-10-CM | POA: Diagnosis not present

## 2019-10-03 DIAGNOSIS — E1159 Type 2 diabetes mellitus with other circulatory complications: Secondary | ICD-10-CM | POA: Diagnosis not present

## 2019-10-03 DIAGNOSIS — I152 Hypertension secondary to endocrine disorders: Secondary | ICD-10-CM | POA: Diagnosis not present

## 2019-10-03 DIAGNOSIS — E785 Hyperlipidemia, unspecified: Secondary | ICD-10-CM | POA: Diagnosis not present

## 2019-10-04 DIAGNOSIS — I152 Hypertension secondary to endocrine disorders: Secondary | ICD-10-CM | POA: Diagnosis not present

## 2019-10-04 DIAGNOSIS — E1169 Type 2 diabetes mellitus with other specified complication: Secondary | ICD-10-CM | POA: Diagnosis not present

## 2019-10-04 DIAGNOSIS — E785 Hyperlipidemia, unspecified: Secondary | ICD-10-CM | POA: Diagnosis not present

## 2019-10-04 DIAGNOSIS — E1159 Type 2 diabetes mellitus with other circulatory complications: Secondary | ICD-10-CM | POA: Diagnosis not present

## 2019-10-16 ENCOUNTER — Other Ambulatory Visit: Payer: Self-pay | Admitting: Family Medicine

## 2019-10-16 DIAGNOSIS — Z1231 Encounter for screening mammogram for malignant neoplasm of breast: Secondary | ICD-10-CM

## 2019-11-03 DIAGNOSIS — E1169 Type 2 diabetes mellitus with other specified complication: Secondary | ICD-10-CM | POA: Diagnosis not present

## 2019-11-03 DIAGNOSIS — I152 Hypertension secondary to endocrine disorders: Secondary | ICD-10-CM | POA: Diagnosis not present

## 2019-11-03 DIAGNOSIS — E785 Hyperlipidemia, unspecified: Secondary | ICD-10-CM | POA: Diagnosis not present

## 2019-11-03 DIAGNOSIS — E1159 Type 2 diabetes mellitus with other circulatory complications: Secondary | ICD-10-CM | POA: Diagnosis not present

## 2019-12-04 DIAGNOSIS — E1169 Type 2 diabetes mellitus with other specified complication: Secondary | ICD-10-CM | POA: Diagnosis not present

## 2019-12-04 DIAGNOSIS — I152 Hypertension secondary to endocrine disorders: Secondary | ICD-10-CM | POA: Diagnosis not present

## 2019-12-04 DIAGNOSIS — E785 Hyperlipidemia, unspecified: Secondary | ICD-10-CM | POA: Diagnosis not present

## 2019-12-04 DIAGNOSIS — E1159 Type 2 diabetes mellitus with other circulatory complications: Secondary | ICD-10-CM | POA: Diagnosis not present

## 2019-12-05 ENCOUNTER — Ambulatory Visit
Admission: RE | Admit: 2019-12-05 | Discharge: 2019-12-05 | Disposition: A | Discharge status: Home / Self Care | Payer: Medicare Other | Source: Ambulatory Visit | Attending: Family Medicine | Admitting: Family Medicine

## 2019-12-05 ENCOUNTER — Other Ambulatory Visit: Payer: Self-pay

## 2019-12-05 DIAGNOSIS — Z1231 Encounter for screening mammogram for malignant neoplasm of breast: Secondary | ICD-10-CM

## 2019-12-18 DIAGNOSIS — E1169 Type 2 diabetes mellitus with other specified complication: Secondary | ICD-10-CM | POA: Diagnosis not present

## 2019-12-25 DIAGNOSIS — E119 Type 2 diabetes mellitus without complications: Secondary | ICD-10-CM | POA: Diagnosis not present

## 2019-12-25 DIAGNOSIS — E785 Hyperlipidemia, unspecified: Secondary | ICD-10-CM | POA: Diagnosis not present

## 2019-12-25 DIAGNOSIS — E1169 Type 2 diabetes mellitus with other specified complication: Secondary | ICD-10-CM | POA: Diagnosis not present

## 2019-12-25 DIAGNOSIS — E1159 Type 2 diabetes mellitus with other circulatory complications: Secondary | ICD-10-CM | POA: Diagnosis not present

## 2020-01-03 DIAGNOSIS — E1169 Type 2 diabetes mellitus with other specified complication: Secondary | ICD-10-CM | POA: Diagnosis not present

## 2020-01-03 DIAGNOSIS — E119 Type 2 diabetes mellitus without complications: Secondary | ICD-10-CM | POA: Diagnosis not present

## 2020-01-03 DIAGNOSIS — E1159 Type 2 diabetes mellitus with other circulatory complications: Secondary | ICD-10-CM | POA: Diagnosis not present

## 2020-01-03 DIAGNOSIS — E785 Hyperlipidemia, unspecified: Secondary | ICD-10-CM | POA: Diagnosis not present

## 2020-02-03 DIAGNOSIS — E1159 Type 2 diabetes mellitus with other circulatory complications: Secondary | ICD-10-CM | POA: Diagnosis not present

## 2020-02-03 DIAGNOSIS — E119 Type 2 diabetes mellitus without complications: Secondary | ICD-10-CM | POA: Diagnosis not present

## 2020-02-03 DIAGNOSIS — E785 Hyperlipidemia, unspecified: Secondary | ICD-10-CM | POA: Diagnosis not present

## 2020-02-03 DIAGNOSIS — E1169 Type 2 diabetes mellitus with other specified complication: Secondary | ICD-10-CM | POA: Diagnosis not present

## 2020-03-05 DIAGNOSIS — E119 Type 2 diabetes mellitus without complications: Secondary | ICD-10-CM | POA: Diagnosis not present

## 2020-03-05 DIAGNOSIS — E1169 Type 2 diabetes mellitus with other specified complication: Secondary | ICD-10-CM | POA: Diagnosis not present

## 2020-03-05 DIAGNOSIS — I152 Hypertension secondary to endocrine disorders: Secondary | ICD-10-CM | POA: Diagnosis not present

## 2020-03-05 DIAGNOSIS — E785 Hyperlipidemia, unspecified: Secondary | ICD-10-CM | POA: Diagnosis not present

## 2020-04-02 DIAGNOSIS — E785 Hyperlipidemia, unspecified: Secondary | ICD-10-CM | POA: Diagnosis not present

## 2020-04-02 DIAGNOSIS — E1169 Type 2 diabetes mellitus with other specified complication: Secondary | ICD-10-CM | POA: Diagnosis not present

## 2020-04-02 DIAGNOSIS — I152 Hypertension secondary to endocrine disorders: Secondary | ICD-10-CM | POA: Diagnosis not present

## 2020-04-02 DIAGNOSIS — E119 Type 2 diabetes mellitus without complications: Secondary | ICD-10-CM | POA: Diagnosis not present

## 2020-05-03 DIAGNOSIS — E1169 Type 2 diabetes mellitus with other specified complication: Secondary | ICD-10-CM | POA: Diagnosis not present

## 2020-05-03 DIAGNOSIS — E119 Type 2 diabetes mellitus without complications: Secondary | ICD-10-CM | POA: Diagnosis not present

## 2020-05-03 DIAGNOSIS — E1159 Type 2 diabetes mellitus with other circulatory complications: Secondary | ICD-10-CM | POA: Diagnosis not present

## 2020-06-02 DIAGNOSIS — E1159 Type 2 diabetes mellitus with other circulatory complications: Secondary | ICD-10-CM | POA: Diagnosis not present

## 2020-06-02 DIAGNOSIS — E119 Type 2 diabetes mellitus without complications: Secondary | ICD-10-CM | POA: Diagnosis not present

## 2020-06-02 DIAGNOSIS — E1169 Type 2 diabetes mellitus with other specified complication: Secondary | ICD-10-CM | POA: Diagnosis not present

## 2020-07-03 DIAGNOSIS — I152 Hypertension secondary to endocrine disorders: Secondary | ICD-10-CM | POA: Diagnosis not present

## 2020-07-03 DIAGNOSIS — E1159 Type 2 diabetes mellitus with other circulatory complications: Secondary | ICD-10-CM | POA: Diagnosis not present

## 2020-07-03 DIAGNOSIS — E785 Hyperlipidemia, unspecified: Secondary | ICD-10-CM | POA: Diagnosis not present

## 2020-08-02 DIAGNOSIS — I152 Hypertension secondary to endocrine disorders: Secondary | ICD-10-CM | POA: Diagnosis not present

## 2020-08-02 DIAGNOSIS — E785 Hyperlipidemia, unspecified: Secondary | ICD-10-CM | POA: Diagnosis not present

## 2020-08-02 DIAGNOSIS — E1159 Type 2 diabetes mellitus with other circulatory complications: Secondary | ICD-10-CM | POA: Diagnosis not present

## 2020-08-13 DIAGNOSIS — E1169 Type 2 diabetes mellitus with other specified complication: Secondary | ICD-10-CM | POA: Diagnosis not present

## 2020-08-13 DIAGNOSIS — E1159 Type 2 diabetes mellitus with other circulatory complications: Secondary | ICD-10-CM | POA: Diagnosis not present

## 2020-08-26 DIAGNOSIS — E785 Hyperlipidemia, unspecified: Secondary | ICD-10-CM | POA: Diagnosis not present

## 2020-08-26 DIAGNOSIS — I152 Hypertension secondary to endocrine disorders: Secondary | ICD-10-CM | POA: Diagnosis not present

## 2020-08-26 DIAGNOSIS — E1169 Type 2 diabetes mellitus with other specified complication: Secondary | ICD-10-CM | POA: Diagnosis not present

## 2020-08-26 DIAGNOSIS — Z139 Encounter for screening, unspecified: Secondary | ICD-10-CM | POA: Diagnosis not present

## 2020-08-26 DIAGNOSIS — Z1339 Encounter for screening examination for other mental health and behavioral disorders: Secondary | ICD-10-CM | POA: Diagnosis not present

## 2020-08-26 DIAGNOSIS — E1159 Type 2 diabetes mellitus with other circulatory complications: Secondary | ICD-10-CM | POA: Diagnosis not present

## 2020-08-26 DIAGNOSIS — Z0000 Encounter for general adult medical examination without abnormal findings: Secondary | ICD-10-CM | POA: Diagnosis not present

## 2020-08-26 DIAGNOSIS — Z1331 Encounter for screening for depression: Secondary | ICD-10-CM | POA: Diagnosis not present

## 2020-09-02 DIAGNOSIS — E785 Hyperlipidemia, unspecified: Secondary | ICD-10-CM | POA: Diagnosis not present

## 2020-09-02 DIAGNOSIS — E1169 Type 2 diabetes mellitus with other specified complication: Secondary | ICD-10-CM | POA: Diagnosis not present

## 2020-09-02 DIAGNOSIS — E1159 Type 2 diabetes mellitus with other circulatory complications: Secondary | ICD-10-CM | POA: Diagnosis not present

## 2020-09-03 DIAGNOSIS — H25811 Combined forms of age-related cataract, right eye: Secondary | ICD-10-CM | POA: Diagnosis not present

## 2020-09-03 DIAGNOSIS — H25812 Combined forms of age-related cataract, left eye: Secondary | ICD-10-CM | POA: Diagnosis not present

## 2020-09-03 DIAGNOSIS — H43822 Vitreomacular adhesion, left eye: Secondary | ICD-10-CM | POA: Diagnosis not present

## 2020-09-03 DIAGNOSIS — Z01818 Encounter for other preprocedural examination: Secondary | ICD-10-CM | POA: Diagnosis not present

## 2020-09-13 DIAGNOSIS — H2512 Age-related nuclear cataract, left eye: Secondary | ICD-10-CM | POA: Diagnosis not present

## 2020-09-13 DIAGNOSIS — H25812 Combined forms of age-related cataract, left eye: Secondary | ICD-10-CM | POA: Diagnosis not present

## 2020-09-27 DIAGNOSIS — H2511 Age-related nuclear cataract, right eye: Secondary | ICD-10-CM | POA: Diagnosis not present

## 2020-09-27 DIAGNOSIS — H25811 Combined forms of age-related cataract, right eye: Secondary | ICD-10-CM | POA: Diagnosis not present

## 2020-10-03 DIAGNOSIS — E1169 Type 2 diabetes mellitus with other specified complication: Secondary | ICD-10-CM | POA: Diagnosis not present

## 2020-10-03 DIAGNOSIS — E1159 Type 2 diabetes mellitus with other circulatory complications: Secondary | ICD-10-CM | POA: Diagnosis not present

## 2020-10-03 DIAGNOSIS — E785 Hyperlipidemia, unspecified: Secondary | ICD-10-CM | POA: Diagnosis not present

## 2020-11-02 DIAGNOSIS — I152 Hypertension secondary to endocrine disorders: Secondary | ICD-10-CM | POA: Diagnosis not present

## 2020-11-02 DIAGNOSIS — E785 Hyperlipidemia, unspecified: Secondary | ICD-10-CM | POA: Diagnosis not present

## 2020-11-02 DIAGNOSIS — E1159 Type 2 diabetes mellitus with other circulatory complications: Secondary | ICD-10-CM | POA: Diagnosis not present

## 2020-11-13 ENCOUNTER — Other Ambulatory Visit: Payer: Self-pay | Admitting: Family Medicine

## 2020-11-13 DIAGNOSIS — Z1231 Encounter for screening mammogram for malignant neoplasm of breast: Secondary | ICD-10-CM

## 2020-12-03 DIAGNOSIS — E1159 Type 2 diabetes mellitus with other circulatory complications: Secondary | ICD-10-CM | POA: Diagnosis not present

## 2020-12-03 DIAGNOSIS — E785 Hyperlipidemia, unspecified: Secondary | ICD-10-CM | POA: Diagnosis not present

## 2020-12-03 DIAGNOSIS — I152 Hypertension secondary to endocrine disorders: Secondary | ICD-10-CM | POA: Diagnosis not present

## 2020-12-03 IMAGING — MG DIGITAL SCREENING BILAT W/ TOMO W/ CAD
8 series · 9 of 24 positions shown · non-contrast
Comparison: Previous exam(s).

CLINICAL DATA: Screening.

EXAM:
DIGITAL SCREENING BILATERAL MAMMOGRAM WITH TOMO AND CAD

[L CC synth-2D]
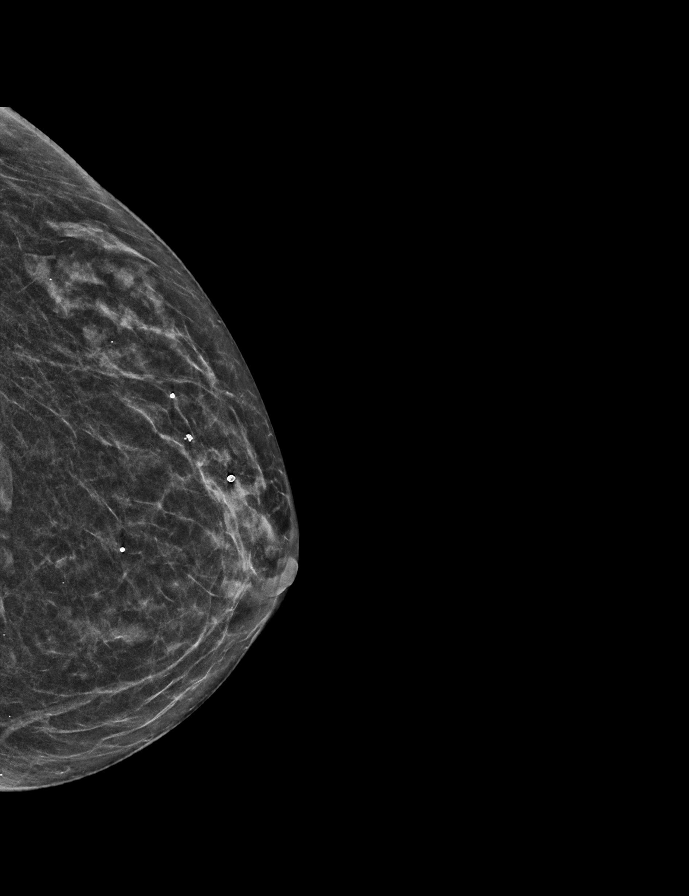

[R CC synth-2D]
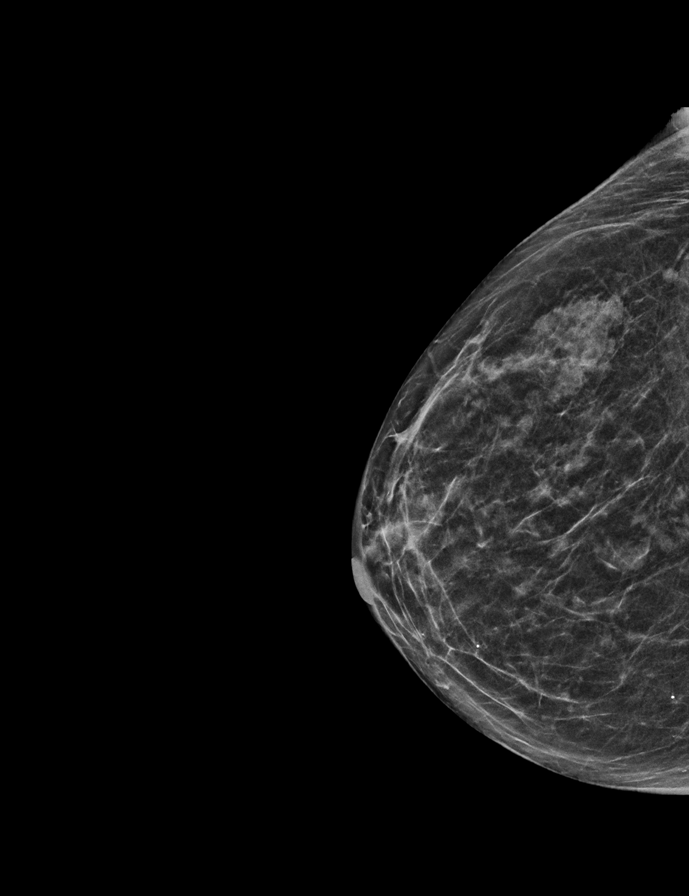

[R MLO synth-2D]
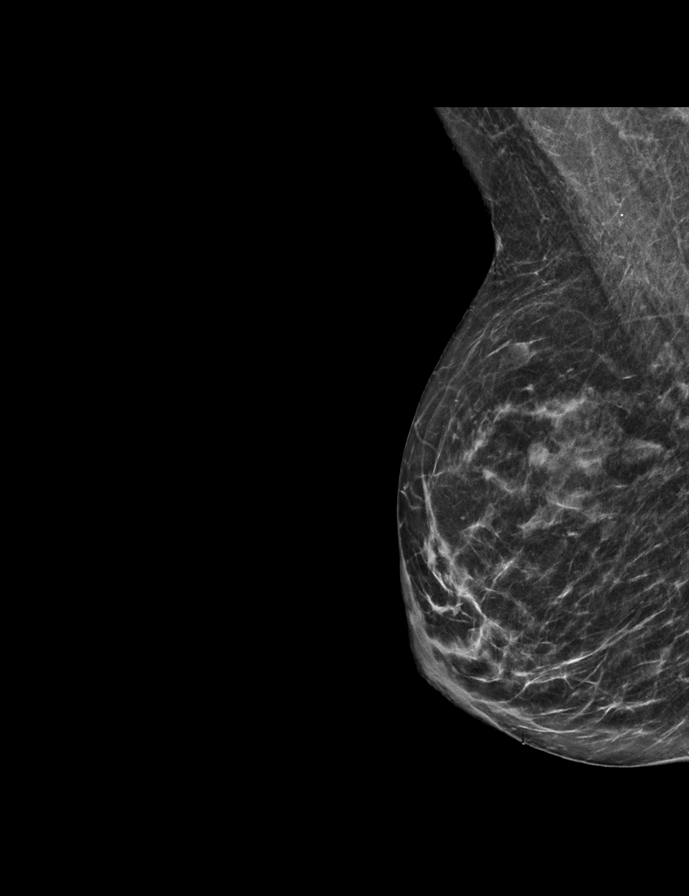

[L MLO synth-2D]
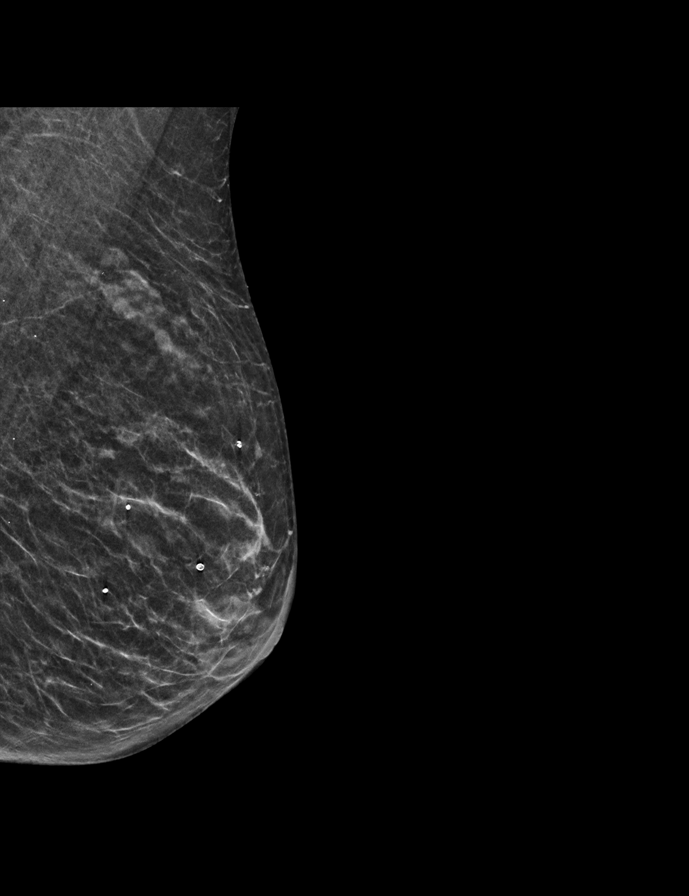

[L MLO tomo · 2 of 42 frames shown]
[frame 14/42]
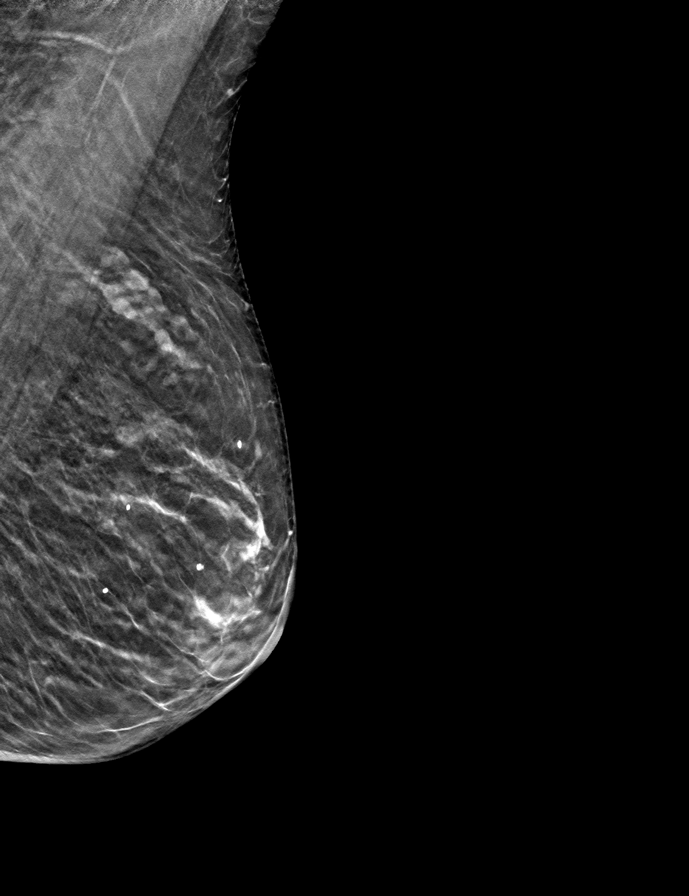
[frame 21/42]
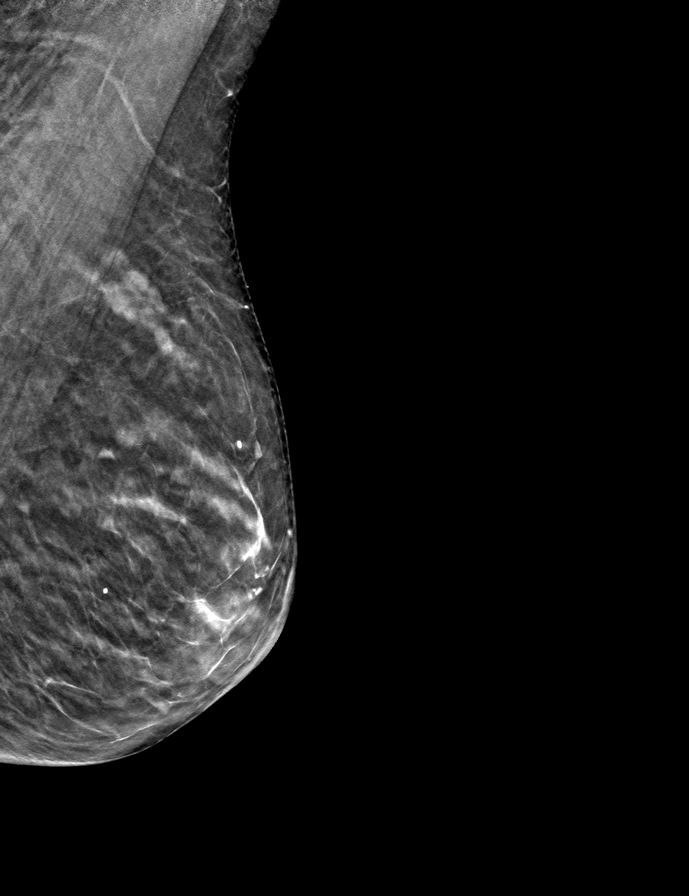

[R MLO tomo · tomo slice 25/48.0]
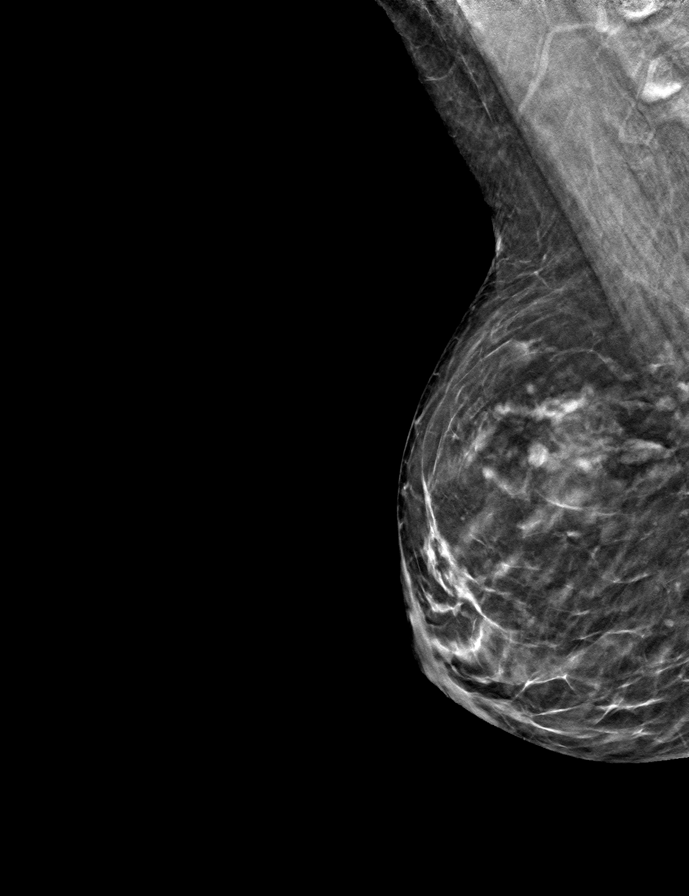

[R CC tomo · tomo slice 21/42.0]
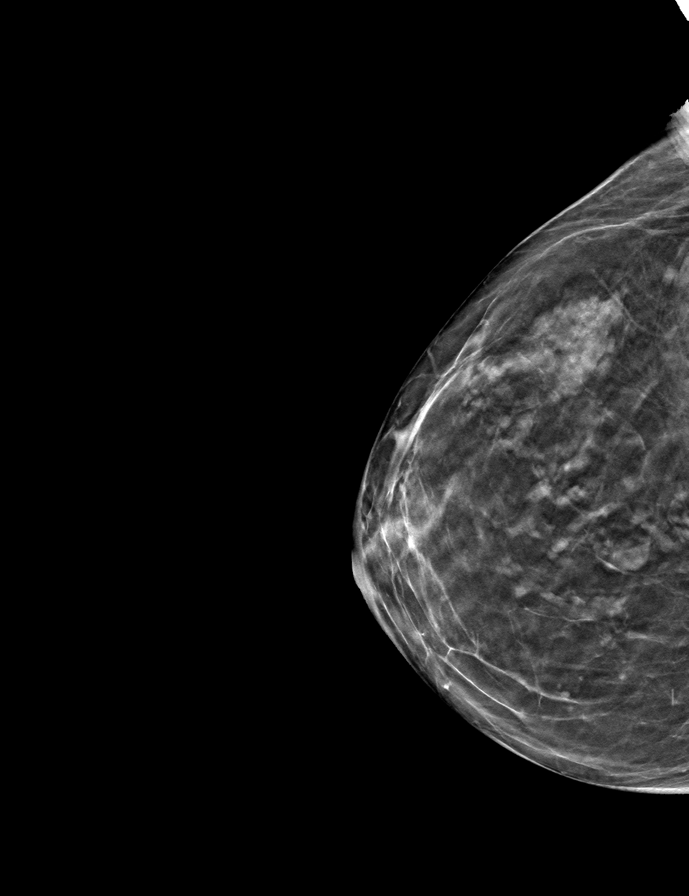

[L CC tomo · tomo slice 22/43.0]
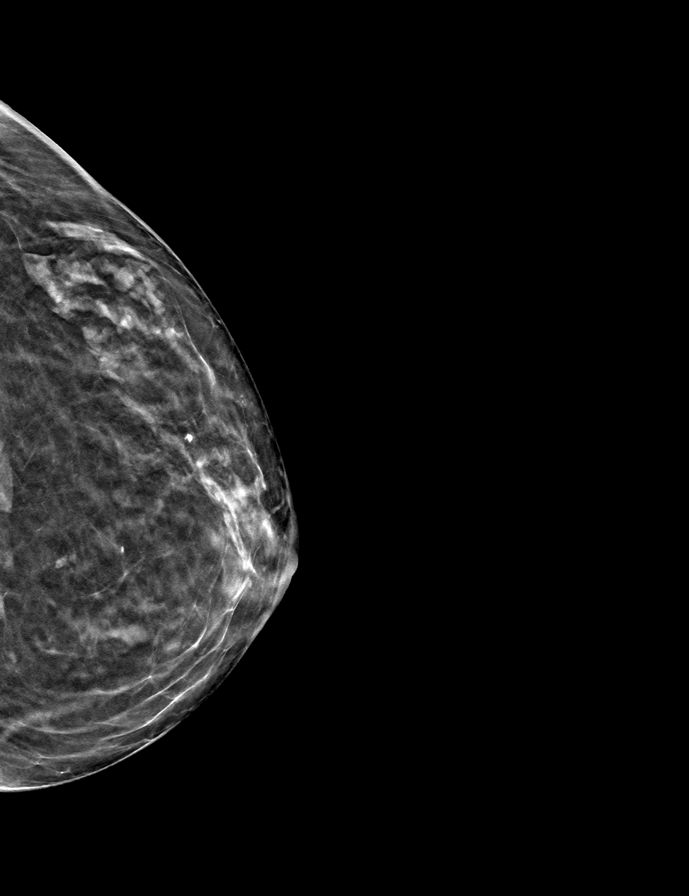

[9 of 24 positions shown; findings below may reference images not displayed]

ACR Breast Density Category c: The breast tissue is heterogeneously
dense, which may obscure small masses.
FINDINGS: There are no findings suspicious for malignancy. Images were
processed with CAD.
IMPRESSION: No mammographic evidence of malignancy. A result letter of this
screening mammogram will be mailed directly to the patient.

RECOMMENDATION:
Screening mammogram in one year. (Code:[id number])

BI-RADS CATEGORY  1: Negative.

## 2020-12-11 ENCOUNTER — Ambulatory Visit
Admission: RE | Admit: 2020-12-11 | Discharge: 2020-12-11 | Disposition: A | Discharge status: Home / Self Care | Payer: HMO | Source: Ambulatory Visit | Attending: Family Medicine | Admitting: Family Medicine

## 2020-12-11 ENCOUNTER — Other Ambulatory Visit: Payer: Self-pay

## 2020-12-11 DIAGNOSIS — Z1231 Encounter for screening mammogram for malignant neoplasm of breast: Secondary | ICD-10-CM

## 2021-01-02 DIAGNOSIS — E1159 Type 2 diabetes mellitus with other circulatory complications: Secondary | ICD-10-CM | POA: Diagnosis not present

## 2021-01-02 DIAGNOSIS — E1169 Type 2 diabetes mellitus with other specified complication: Secondary | ICD-10-CM | POA: Diagnosis not present

## 2021-01-09 DIAGNOSIS — E1169 Type 2 diabetes mellitus with other specified complication: Secondary | ICD-10-CM | POA: Diagnosis not present

## 2021-01-17 DIAGNOSIS — E1169 Type 2 diabetes mellitus with other specified complication: Secondary | ICD-10-CM | POA: Diagnosis not present

## 2021-01-17 DIAGNOSIS — Z139 Encounter for screening, unspecified: Secondary | ICD-10-CM | POA: Diagnosis not present

## 2021-01-17 DIAGNOSIS — I10 Essential (primary) hypertension: Secondary | ICD-10-CM | POA: Diagnosis not present

## 2021-01-17 DIAGNOSIS — E785 Hyperlipidemia, unspecified: Secondary | ICD-10-CM | POA: Diagnosis not present

## 2021-03-05 DIAGNOSIS — E785 Hyperlipidemia, unspecified: Secondary | ICD-10-CM | POA: Diagnosis not present

## 2021-03-05 DIAGNOSIS — E1159 Type 2 diabetes mellitus with other circulatory complications: Secondary | ICD-10-CM | POA: Diagnosis not present

## 2021-04-02 DIAGNOSIS — E785 Hyperlipidemia, unspecified: Secondary | ICD-10-CM | POA: Diagnosis not present

## 2021-04-02 DIAGNOSIS — E1159 Type 2 diabetes mellitus with other circulatory complications: Secondary | ICD-10-CM | POA: Diagnosis not present

## 2021-04-02 DIAGNOSIS — E1169 Type 2 diabetes mellitus with other specified complication: Secondary | ICD-10-CM | POA: Diagnosis not present

## 2021-05-03 DIAGNOSIS — E1169 Type 2 diabetes mellitus with other specified complication: Secondary | ICD-10-CM | POA: Diagnosis not present

## 2021-05-03 DIAGNOSIS — E785 Hyperlipidemia, unspecified: Secondary | ICD-10-CM | POA: Diagnosis not present

## 2021-05-12 DIAGNOSIS — E1159 Type 2 diabetes mellitus with other circulatory complications: Secondary | ICD-10-CM | POA: Diagnosis not present

## 2021-05-12 DIAGNOSIS — E1169 Type 2 diabetes mellitus with other specified complication: Secondary | ICD-10-CM | POA: Diagnosis not present

## 2021-05-19 DIAGNOSIS — E1169 Type 2 diabetes mellitus with other specified complication: Secondary | ICD-10-CM | POA: Diagnosis not present

## 2021-05-19 DIAGNOSIS — I152 Hypertension secondary to endocrine disorders: Secondary | ICD-10-CM | POA: Diagnosis not present

## 2021-05-19 DIAGNOSIS — E785 Hyperlipidemia, unspecified: Secondary | ICD-10-CM | POA: Diagnosis not present

## 2021-05-19 DIAGNOSIS — E1159 Type 2 diabetes mellitus with other circulatory complications: Secondary | ICD-10-CM | POA: Diagnosis not present

## 2021-06-02 DIAGNOSIS — E785 Hyperlipidemia, unspecified: Secondary | ICD-10-CM | POA: Diagnosis not present

## 2021-06-02 DIAGNOSIS — E1169 Type 2 diabetes mellitus with other specified complication: Secondary | ICD-10-CM | POA: Diagnosis not present

## 2021-07-03 DIAGNOSIS — E785 Hyperlipidemia, unspecified: Secondary | ICD-10-CM | POA: Diagnosis not present

## 2021-07-03 DIAGNOSIS — E1169 Type 2 diabetes mellitus with other specified complication: Secondary | ICD-10-CM | POA: Diagnosis not present

## 2021-08-02 DIAGNOSIS — E1169 Type 2 diabetes mellitus with other specified complication: Secondary | ICD-10-CM | POA: Diagnosis not present

## 2021-08-02 DIAGNOSIS — E785 Hyperlipidemia, unspecified: Secondary | ICD-10-CM | POA: Diagnosis not present

## 2021-09-02 DIAGNOSIS — E785 Hyperlipidemia, unspecified: Secondary | ICD-10-CM | POA: Diagnosis not present

## 2021-09-02 DIAGNOSIS — E1169 Type 2 diabetes mellitus with other specified complication: Secondary | ICD-10-CM | POA: Diagnosis not present

## 2021-09-08 DIAGNOSIS — E1159 Type 2 diabetes mellitus with other circulatory complications: Secondary | ICD-10-CM | POA: Diagnosis not present

## 2021-09-08 DIAGNOSIS — E1169 Type 2 diabetes mellitus with other specified complication: Secondary | ICD-10-CM | POA: Diagnosis not present

## 2021-09-15 DIAGNOSIS — E785 Hyperlipidemia, unspecified: Secondary | ICD-10-CM | POA: Diagnosis not present

## 2021-09-15 DIAGNOSIS — Z1389 Encounter for screening for other disorder: Secondary | ICD-10-CM | POA: Diagnosis not present

## 2021-09-15 DIAGNOSIS — Z1331 Encounter for screening for depression: Secondary | ICD-10-CM | POA: Diagnosis not present

## 2021-09-15 DIAGNOSIS — Z0000 Encounter for general adult medical examination without abnormal findings: Secondary | ICD-10-CM | POA: Diagnosis not present

## 2021-09-15 DIAGNOSIS — Z6820 Body mass index (BMI) 20.0-20.9, adult: Secondary | ICD-10-CM | POA: Diagnosis not present

## 2021-09-15 DIAGNOSIS — E1169 Type 2 diabetes mellitus with other specified complication: Secondary | ICD-10-CM | POA: Diagnosis not present

## 2021-09-15 DIAGNOSIS — Z139 Encounter for screening, unspecified: Secondary | ICD-10-CM | POA: Diagnosis not present

## 2021-09-15 DIAGNOSIS — Z1339 Encounter for screening examination for other mental health and behavioral disorders: Secondary | ICD-10-CM | POA: Diagnosis not present

## 2021-09-15 DIAGNOSIS — Z136 Encounter for screening for cardiovascular disorders: Secondary | ICD-10-CM | POA: Diagnosis not present

## 2021-09-15 DIAGNOSIS — I10 Essential (primary) hypertension: Secondary | ICD-10-CM | POA: Diagnosis not present

## 2021-10-03 DIAGNOSIS — E785 Hyperlipidemia, unspecified: Secondary | ICD-10-CM | POA: Diagnosis not present

## 2021-10-03 DIAGNOSIS — E1159 Type 2 diabetes mellitus with other circulatory complications: Secondary | ICD-10-CM | POA: Diagnosis not present

## 2021-11-02 DIAGNOSIS — E785 Hyperlipidemia, unspecified: Secondary | ICD-10-CM | POA: Diagnosis not present

## 2021-11-02 DIAGNOSIS — E1159 Type 2 diabetes mellitus with other circulatory complications: Secondary | ICD-10-CM | POA: Diagnosis not present

## 2022-02-02 DIAGNOSIS — E785 Hyperlipidemia, unspecified: Secondary | ICD-10-CM | POA: Diagnosis not present

## 2022-02-02 DIAGNOSIS — E1159 Type 2 diabetes mellitus with other circulatory complications: Secondary | ICD-10-CM | POA: Diagnosis not present

## 2022-03-05 DIAGNOSIS — E1159 Type 2 diabetes mellitus with other circulatory complications: Secondary | ICD-10-CM | POA: Diagnosis not present

## 2022-03-05 DIAGNOSIS — E785 Hyperlipidemia, unspecified: Secondary | ICD-10-CM | POA: Diagnosis not present

## 2022-04-03 DIAGNOSIS — E785 Hyperlipidemia, unspecified: Secondary | ICD-10-CM | POA: Diagnosis not present

## 2022-04-03 DIAGNOSIS — E1159 Type 2 diabetes mellitus with other circulatory complications: Secondary | ICD-10-CM | POA: Diagnosis not present

## 2022-05-04 DIAGNOSIS — E1159 Type 2 diabetes mellitus with other circulatory complications: Secondary | ICD-10-CM | POA: Diagnosis not present

## 2022-05-04 DIAGNOSIS — E785 Hyperlipidemia, unspecified: Secondary | ICD-10-CM | POA: Diagnosis not present

## 2022-05-11 DIAGNOSIS — E1169 Type 2 diabetes mellitus with other specified complication: Secondary | ICD-10-CM | POA: Diagnosis not present

## 2022-05-11 DIAGNOSIS — E1159 Type 2 diabetes mellitus with other circulatory complications: Secondary | ICD-10-CM | POA: Diagnosis not present

## 2022-05-18 DIAGNOSIS — E785 Hyperlipidemia, unspecified: Secondary | ICD-10-CM | POA: Diagnosis not present

## 2022-05-18 DIAGNOSIS — I10 Essential (primary) hypertension: Secondary | ICD-10-CM | POA: Diagnosis not present

## 2022-05-18 DIAGNOSIS — Z6820 Body mass index (BMI) 20.0-20.9, adult: Secondary | ICD-10-CM | POA: Diagnosis not present

## 2022-05-18 DIAGNOSIS — E1169 Type 2 diabetes mellitus with other specified complication: Secondary | ICD-10-CM | POA: Diagnosis not present

## 2022-06-03 DIAGNOSIS — I10 Essential (primary) hypertension: Secondary | ICD-10-CM | POA: Diagnosis not present

## 2022-06-03 DIAGNOSIS — E785 Hyperlipidemia, unspecified: Secondary | ICD-10-CM | POA: Diagnosis not present

## 2022-07-04 DIAGNOSIS — E785 Hyperlipidemia, unspecified: Secondary | ICD-10-CM | POA: Diagnosis not present

## 2022-07-04 DIAGNOSIS — I10 Essential (primary) hypertension: Secondary | ICD-10-CM | POA: Diagnosis not present

## 2022-08-03 DIAGNOSIS — I10 Essential (primary) hypertension: Secondary | ICD-10-CM | POA: Diagnosis not present

## 2022-08-03 DIAGNOSIS — E785 Hyperlipidemia, unspecified: Secondary | ICD-10-CM | POA: Diagnosis not present

## 2022-08-14 DIAGNOSIS — R310 Gross hematuria: Secondary | ICD-10-CM | POA: Diagnosis not present

## 2022-08-14 DIAGNOSIS — Z6820 Body mass index (BMI) 20.0-20.9, adult: Secondary | ICD-10-CM | POA: Diagnosis not present

## 2022-08-14 DIAGNOSIS — Z139 Encounter for screening, unspecified: Secondary | ICD-10-CM | POA: Diagnosis not present

## 2022-09-03 DIAGNOSIS — E785 Hyperlipidemia, unspecified: Secondary | ICD-10-CM | POA: Diagnosis not present

## 2022-09-03 DIAGNOSIS — I10 Essential (primary) hypertension: Secondary | ICD-10-CM | POA: Diagnosis not present

## 2022-09-14 DIAGNOSIS — E1159 Type 2 diabetes mellitus with other circulatory complications: Secondary | ICD-10-CM | POA: Diagnosis not present

## 2022-09-14 DIAGNOSIS — E1169 Type 2 diabetes mellitus with other specified complication: Secondary | ICD-10-CM | POA: Diagnosis not present

## 2022-09-21 DIAGNOSIS — Z139 Encounter for screening, unspecified: Secondary | ICD-10-CM | POA: Diagnosis not present

## 2022-09-21 DIAGNOSIS — Z1389 Encounter for screening for other disorder: Secondary | ICD-10-CM | POA: Diagnosis not present

## 2022-09-21 DIAGNOSIS — I152 Hypertension secondary to endocrine disorders: Secondary | ICD-10-CM | POA: Diagnosis not present

## 2022-09-21 DIAGNOSIS — Z1331 Encounter for screening for depression: Secondary | ICD-10-CM | POA: Diagnosis not present

## 2022-09-21 DIAGNOSIS — E1159 Type 2 diabetes mellitus with other circulatory complications: Secondary | ICD-10-CM | POA: Diagnosis not present

## 2022-09-21 DIAGNOSIS — E785 Hyperlipidemia, unspecified: Secondary | ICD-10-CM | POA: Diagnosis not present

## 2022-09-21 DIAGNOSIS — Z0000 Encounter for general adult medical examination without abnormal findings: Secondary | ICD-10-CM | POA: Diagnosis not present

## 2022-09-21 DIAGNOSIS — E1169 Type 2 diabetes mellitus with other specified complication: Secondary | ICD-10-CM | POA: Diagnosis not present

## 2022-09-21 DIAGNOSIS — Z136 Encounter for screening for cardiovascular disorders: Secondary | ICD-10-CM | POA: Diagnosis not present

## 2022-09-21 DIAGNOSIS — Z6820 Body mass index (BMI) 20.0-20.9, adult: Secondary | ICD-10-CM | POA: Diagnosis not present

## 2022-09-21 DIAGNOSIS — Z1339 Encounter for screening examination for other mental health and behavioral disorders: Secondary | ICD-10-CM | POA: Diagnosis not present

## 2022-10-04 DIAGNOSIS — I10 Essential (primary) hypertension: Secondary | ICD-10-CM | POA: Diagnosis not present

## 2022-10-04 DIAGNOSIS — E785 Hyperlipidemia, unspecified: Secondary | ICD-10-CM | POA: Diagnosis not present

## 2022-11-03 DIAGNOSIS — I10 Essential (primary) hypertension: Secondary | ICD-10-CM | POA: Diagnosis not present

## 2022-11-03 DIAGNOSIS — E785 Hyperlipidemia, unspecified: Secondary | ICD-10-CM | POA: Diagnosis not present

## 2022-12-04 DIAGNOSIS — I10 Essential (primary) hypertension: Secondary | ICD-10-CM | POA: Diagnosis not present

## 2022-12-04 DIAGNOSIS — E785 Hyperlipidemia, unspecified: Secondary | ICD-10-CM | POA: Diagnosis not present

## 2023-01-03 DIAGNOSIS — I10 Essential (primary) hypertension: Secondary | ICD-10-CM | POA: Diagnosis not present

## 2023-01-03 DIAGNOSIS — E785 Hyperlipidemia, unspecified: Secondary | ICD-10-CM | POA: Diagnosis not present

## 2023-01-18 DIAGNOSIS — E1159 Type 2 diabetes mellitus with other circulatory complications: Secondary | ICD-10-CM | POA: Diagnosis not present

## 2023-01-18 DIAGNOSIS — E1169 Type 2 diabetes mellitus with other specified complication: Secondary | ICD-10-CM | POA: Diagnosis not present

## 2023-02-01 DIAGNOSIS — E785 Hyperlipidemia, unspecified: Secondary | ICD-10-CM | POA: Diagnosis not present

## 2023-02-01 DIAGNOSIS — Z681 Body mass index (BMI) 19 or less, adult: Secondary | ICD-10-CM | POA: Diagnosis not present

## 2023-02-01 DIAGNOSIS — I152 Hypertension secondary to endocrine disorders: Secondary | ICD-10-CM | POA: Diagnosis not present

## 2023-02-01 DIAGNOSIS — E1169 Type 2 diabetes mellitus with other specified complication: Secondary | ICD-10-CM | POA: Diagnosis not present

## 2023-02-01 DIAGNOSIS — E1159 Type 2 diabetes mellitus with other circulatory complications: Secondary | ICD-10-CM | POA: Diagnosis not present

## 2023-02-03 DIAGNOSIS — E785 Hyperlipidemia, unspecified: Secondary | ICD-10-CM | POA: Diagnosis not present

## 2023-02-03 DIAGNOSIS — I10 Essential (primary) hypertension: Secondary | ICD-10-CM | POA: Diagnosis not present

## 2023-03-06 DIAGNOSIS — I10 Essential (primary) hypertension: Secondary | ICD-10-CM | POA: Diagnosis not present

## 2023-03-06 DIAGNOSIS — E785 Hyperlipidemia, unspecified: Secondary | ICD-10-CM | POA: Diagnosis not present

## 2023-04-03 DIAGNOSIS — E785 Hyperlipidemia, unspecified: Secondary | ICD-10-CM | POA: Diagnosis not present

## 2023-04-03 DIAGNOSIS — I10 Essential (primary) hypertension: Secondary | ICD-10-CM | POA: Diagnosis not present

## 2023-05-04 DIAGNOSIS — E1159 Type 2 diabetes mellitus with other circulatory complications: Secondary | ICD-10-CM | POA: Diagnosis not present

## 2023-05-04 DIAGNOSIS — Z9181 History of falling: Secondary | ICD-10-CM | POA: Diagnosis not present

## 2023-05-25 DIAGNOSIS — E1159 Type 2 diabetes mellitus with other circulatory complications: Secondary | ICD-10-CM | POA: Diagnosis not present

## 2023-05-25 DIAGNOSIS — E1169 Type 2 diabetes mellitus with other specified complication: Secondary | ICD-10-CM | POA: Diagnosis not present

## 2023-05-31 DIAGNOSIS — E785 Hyperlipidemia, unspecified: Secondary | ICD-10-CM | POA: Diagnosis not present

## 2023-05-31 DIAGNOSIS — E1159 Type 2 diabetes mellitus with other circulatory complications: Secondary | ICD-10-CM | POA: Diagnosis not present

## 2023-05-31 DIAGNOSIS — Z6820 Body mass index (BMI) 20.0-20.9, adult: Secondary | ICD-10-CM | POA: Diagnosis not present

## 2023-05-31 DIAGNOSIS — E1169 Type 2 diabetes mellitus with other specified complication: Secondary | ICD-10-CM | POA: Diagnosis not present

## 2023-05-31 DIAGNOSIS — I152 Hypertension secondary to endocrine disorders: Secondary | ICD-10-CM | POA: Diagnosis not present

## 2023-06-03 DIAGNOSIS — Z9181 History of falling: Secondary | ICD-10-CM | POA: Diagnosis not present

## 2023-06-03 DIAGNOSIS — E1159 Type 2 diabetes mellitus with other circulatory complications: Secondary | ICD-10-CM | POA: Diagnosis not present

## 2023-06-08 DIAGNOSIS — Z681 Body mass index (BMI) 19 or less, adult: Secondary | ICD-10-CM | POA: Diagnosis not present

## 2023-06-08 DIAGNOSIS — G3184 Mild cognitive impairment, so stated: Secondary | ICD-10-CM | POA: Diagnosis not present

## 2023-06-08 DIAGNOSIS — R413 Other amnesia: Secondary | ICD-10-CM | POA: Diagnosis not present

## 2023-06-22 DIAGNOSIS — R413 Other amnesia: Secondary | ICD-10-CM | POA: Diagnosis not present

## 2023-06-22 DIAGNOSIS — Z6820 Body mass index (BMI) 20.0-20.9, adult: Secondary | ICD-10-CM | POA: Diagnosis not present

## 2023-07-04 DIAGNOSIS — E1159 Type 2 diabetes mellitus with other circulatory complications: Secondary | ICD-10-CM | POA: Diagnosis not present

## 2023-07-04 DIAGNOSIS — Z9181 History of falling: Secondary | ICD-10-CM | POA: Diagnosis not present

## 2023-08-03 DIAGNOSIS — E1159 Type 2 diabetes mellitus with other circulatory complications: Secondary | ICD-10-CM | POA: Diagnosis not present

## 2023-08-03 DIAGNOSIS — Z9181 History of falling: Secondary | ICD-10-CM | POA: Diagnosis not present

## 2023-09-03 DIAGNOSIS — Z9181 History of falling: Secondary | ICD-10-CM | POA: Diagnosis not present

## 2023-09-03 DIAGNOSIS — E1159 Type 2 diabetes mellitus with other circulatory complications: Secondary | ICD-10-CM | POA: Diagnosis not present

## 2023-09-24 DIAGNOSIS — E1159 Type 2 diabetes mellitus with other circulatory complications: Secondary | ICD-10-CM | POA: Diagnosis not present

## 2023-09-24 DIAGNOSIS — Z7689 Persons encountering health services in other specified circumstances: Secondary | ICD-10-CM | POA: Diagnosis not present

## 2023-09-24 DIAGNOSIS — E1169 Type 2 diabetes mellitus with other specified complication: Secondary | ICD-10-CM | POA: Diagnosis not present

## 2023-10-04 DIAGNOSIS — E1159 Type 2 diabetes mellitus with other circulatory complications: Secondary | ICD-10-CM | POA: Diagnosis not present

## 2023-10-04 DIAGNOSIS — Z9181 History of falling: Secondary | ICD-10-CM | POA: Diagnosis not present

## 2023-10-08 DIAGNOSIS — Z1389 Encounter for screening for other disorder: Secondary | ICD-10-CM | POA: Diagnosis not present

## 2023-10-08 DIAGNOSIS — E1159 Type 2 diabetes mellitus with other circulatory complications: Secondary | ICD-10-CM | POA: Diagnosis not present

## 2023-10-08 DIAGNOSIS — E785 Hyperlipidemia, unspecified: Secondary | ICD-10-CM | POA: Diagnosis not present

## 2023-10-08 DIAGNOSIS — I10 Essential (primary) hypertension: Secondary | ICD-10-CM | POA: Diagnosis not present

## 2023-10-08 DIAGNOSIS — Z0000 Encounter for general adult medical examination without abnormal findings: Secondary | ICD-10-CM | POA: Diagnosis not present

## 2023-10-08 DIAGNOSIS — Z1331 Encounter for screening for depression: Secondary | ICD-10-CM | POA: Diagnosis not present

## 2023-10-08 DIAGNOSIS — Z1339 Encounter for screening examination for other mental health and behavioral disorders: Secondary | ICD-10-CM | POA: Diagnosis not present

## 2023-10-08 DIAGNOSIS — Z139 Encounter for screening, unspecified: Secondary | ICD-10-CM | POA: Diagnosis not present

## 2023-10-08 DIAGNOSIS — Z6820 Body mass index (BMI) 20.0-20.9, adult: Secondary | ICD-10-CM | POA: Diagnosis not present

## 2023-10-08 DIAGNOSIS — I152 Hypertension secondary to endocrine disorders: Secondary | ICD-10-CM | POA: Diagnosis not present

## 2023-10-08 DIAGNOSIS — Z136 Encounter for screening for cardiovascular disorders: Secondary | ICD-10-CM | POA: Diagnosis not present

## 2023-11-03 DIAGNOSIS — I152 Hypertension secondary to endocrine disorders: Secondary | ICD-10-CM | POA: Diagnosis not present

## 2023-11-03 DIAGNOSIS — E1159 Type 2 diabetes mellitus with other circulatory complications: Secondary | ICD-10-CM | POA: Diagnosis not present

## 2023-12-04 DIAGNOSIS — E1159 Type 2 diabetes mellitus with other circulatory complications: Secondary | ICD-10-CM | POA: Diagnosis not present

## 2023-12-04 DIAGNOSIS — I152 Hypertension secondary to endocrine disorders: Secondary | ICD-10-CM | POA: Diagnosis not present

## 2024-01-03 DIAGNOSIS — E1159 Type 2 diabetes mellitus with other circulatory complications: Secondary | ICD-10-CM | POA: Diagnosis not present

## 2024-01-03 DIAGNOSIS — I152 Hypertension secondary to endocrine disorders: Secondary | ICD-10-CM | POA: Diagnosis not present

## 2024-01-12 DIAGNOSIS — R300 Dysuria: Secondary | ICD-10-CM | POA: Diagnosis not present

## 2024-01-12 DIAGNOSIS — N390 Urinary tract infection, site not specified: Secondary | ICD-10-CM | POA: Diagnosis not present

## 2024-01-12 DIAGNOSIS — I152 Hypertension secondary to endocrine disorders: Secondary | ICD-10-CM | POA: Diagnosis not present

## 2024-01-12 DIAGNOSIS — E1159 Type 2 diabetes mellitus with other circulatory complications: Secondary | ICD-10-CM | POA: Diagnosis not present

## 2024-01-12 DIAGNOSIS — Z681 Body mass index (BMI) 19 or less, adult: Secondary | ICD-10-CM | POA: Diagnosis not present

## 2024-01-12 DIAGNOSIS — R82998 Other abnormal findings in urine: Secondary | ICD-10-CM | POA: Diagnosis not present
